# Patient Record
Sex: Female | Born: 1970 | Smoking: Former smoker
Health system: Southern US, Community
[De-identification: ages and names within clinical notes are randomized; demographics above are authoritative.]

## PROBLEM LIST (undated history)

## (undated) ENCOUNTER — Inpatient Hospital Stay (HOSPITAL_COMMUNITY): Payer: Self-pay

## (undated) DIAGNOSIS — B20 Human immunodeficiency virus [HIV] disease: Secondary | ICD-10-CM

## (undated) DIAGNOSIS — I10 Essential (primary) hypertension: Secondary | ICD-10-CM

## (undated) DIAGNOSIS — Z21 Asymptomatic human immunodeficiency virus [HIV] infection status: Secondary | ICD-10-CM

## (undated) HISTORY — PX: INDUCED ABORTION: SHX677

## (undated) HISTORY — DX: Essential (primary) hypertension: I10

---

## 2011-03-25 NOTE — L&D Delivery Note (Signed)
Delivery Note At 1:06 AM a viable female was delivered via Vaginal, Spontaneous Delivery (Presentation: Right Occiput Anterior).  APGAR: 7, 9; weight 8 lb 5.7 oz (3790 g).   Placenta status: Intact, Spontaneous.  Cord: 3 vessels with the following complications: None.  Cord pH: none  Anesthesia: Epidural  Episiotomy: None Lacerations: 2nd degree;Perineal Suture Repair: 2.0 chromic Est. Blood Loss (mL): 350  Mom to postpartum.  Baby to nursery-stable.  Jadzia Ibsen A 10/01/2011, 1:34 AM

## 2011-04-01 LAB — OB RESULTS CONSOLE ABO/RH: RH Type: POSITIVE

## 2011-04-01 LAB — OB RESULTS CONSOLE ANTIBODY SCREEN: Antibody Screen: NEGATIVE

## 2011-04-01 LAB — OB RESULTS CONSOLE GC/CHLAMYDIA: Gonorrhea: NEGATIVE

## 2011-04-02 ENCOUNTER — Other Ambulatory Visit: Payer: Self-pay | Admitting: Obstetrics & Gynecology

## 2011-04-02 DIAGNOSIS — Z3682 Encounter for antenatal screening for nuchal translucency: Secondary | ICD-10-CM

## 2011-04-02 DIAGNOSIS — O09529 Supervision of elderly multigravida, unspecified trimester: Secondary | ICD-10-CM

## 2011-04-09 ENCOUNTER — Ambulatory Visit (HOSPITAL_COMMUNITY)
Admission: RE | Admit: 2011-04-09 | Discharge: 2011-04-09 | Disposition: A | Discharge status: Home / Self Care | Payer: Medicaid Other | Source: Ambulatory Visit | Attending: Obstetrics & Gynecology | Admitting: Obstetrics & Gynecology

## 2011-04-09 ENCOUNTER — Other Ambulatory Visit: Payer: Self-pay

## 2011-04-09 DIAGNOSIS — O09529 Supervision of elderly multigravida, unspecified trimester: Secondary | ICD-10-CM | POA: Insufficient documentation

## 2011-04-09 DIAGNOSIS — Z3689 Encounter for other specified antenatal screening: Secondary | ICD-10-CM | POA: Insufficient documentation

## 2011-04-09 DIAGNOSIS — Z3682 Encounter for antenatal screening for nuchal translucency: Secondary | ICD-10-CM

## 2011-04-09 DIAGNOSIS — O351XX0 Maternal care for (suspected) chromosomal abnormality in fetus, not applicable or unspecified: Secondary | ICD-10-CM | POA: Insufficient documentation

## 2011-04-09 DIAGNOSIS — O3510X0 Maternal care for (suspected) chromosomal abnormality in fetus, unspecified, not applicable or unspecified: Secondary | ICD-10-CM | POA: Insufficient documentation

## 2011-04-10 NOTE — Progress Notes (Addendum)
Genetic Counseling  High-Risk Gestation Note  Appointment Date:  04/09/2011 Referred By: Roseanna Rainbow, * Date of Birth:  08-Nov-1970 Partner:  Haynes Dage Attending: Rema Fendt, MD   Mrs. Zackery Barefoot and her husband, Mr. Nnenna Meador, were seen for genetic counseling because of a maternal age of 41 y.o.Marland Kitchen     They were counseled regarding maternal age and the association with risk for chromosome conditions due to nondisjunction with aging of the ova.   We reviewed chromosomes, nondisjunction, and the associated 1 in 11 risk for fetal aneuploidy related to a maternal age of 41 y.o. at [redacted] weeks gestation.  They were counseled that the risk for aneuploidy decreases as gestational age increases, accounting for those pregnancies which spontaneously abort.  We specifically discussed Down syndrome (trisomy 67), trisomies 36 and 24, and sex chromosome aneuploidies (47,XXX and 47,XXY) including the common features and prognoses of each.   We reviewed available screening and diagnostic options.  Regarding screening tests, we discussed the options of First screen and ultrasound.  They understand that screening tests are used to modify a patient's a priori risk for aneuploidy, typically based on age.  This estimate provides a pregnancy specific risk assessment.  We also reviewed the availability of diagnostic options including CVS and amniocentesis.  We discussed the risks, limitations, and benefits of each.  After reviewing these options, Mrs. Merten elected to have First trimester screening at the time of today's visit, but declined CVS and amniocentesis.   She understands that ultrasound and first trimester screening cannot rule out all birth defects or genetic syndromes. The patient was advised of this limitation and states she still does not want diagnostic testing at this time or in the future given the associated risk of complications.  However, they were counseled that 50-80% of  fetuses with Down syndrome and up to 90% of fetuses with trisomies 13 and 18, when well visualized, have detectable anomalies or soft markers by ultrasound.   Mrs. Cuda was provided with written information regarding sickle cell anemia (SCA) including the carrier frequency and incidence in the African-American population, the availability of carrier testing and prenatal diagnosis if indicated.  In addition, we discussed that hemoglobinopathies are routinely screened for as part of the [place] newborn screening panel.  She declined hemoglobin electrophoresis today.  Both family histories were reviewed and found to be noncontributory for birth defects, mental retardation, and known genetic conditions. Without further information regarding the provided family history, an accurate genetic risk cannot be calculated. Further genetic counseling is warranted if more information is obtained.  Mrs. Graceanne Guin denied exposure to environmental toxins or chemical agents. She denied the use of alcohol, tobacco or street drugs. She denied significant viral illnesses during the course of her pregnancy. Her medical and surgical histories were contributory for 3 past TABs.   I counseled this couple regarding the above risks and available options.  The approximate face-to-face time with the genetic counselor was 45 minutes.  Quinn Plowman, MS,  Certified Genetic Counselor 04/10/2011

## 2011-04-11 DIAGNOSIS — Z113 Encounter for screening for infections with a predominantly sexual mode of transmission: Secondary | ICD-10-CM

## 2011-04-14 ENCOUNTER — Encounter (HOSPITAL_COMMUNITY): Payer: Self-pay

## 2011-04-16 ENCOUNTER — Telehealth: Payer: Self-pay

## 2011-04-16 NOTE — Telephone Encounter (Signed)
Medical records received for newly diagnosed pregnant 042 from [facility].  Dr Jean Rosenthal Christell Constant. Appointment scheduled for 04-16-10 with Dr Drue Second w/o intake.  Any labs needed will be done same day.   I have spoken with patient and she will need assistance with transportation and has expressed concerns for counseling.   [facility] have been [facility].  Alisa with [facility] will call patient today to set up appointment for counseling.  Patient advised the above people will contacting her to make arrangements.   Laurell Josephs, RN  , BSN

## 2011-04-17 ENCOUNTER — Encounter: Payer: Self-pay | Admitting: Internal Medicine

## 2011-04-17 ENCOUNTER — Other Ambulatory Visit: Payer: Self-pay | Admitting: Internal Medicine

## 2011-04-17 ENCOUNTER — Ambulatory Visit (INDEPENDENT_AMBULATORY_CARE_PROVIDER_SITE_OTHER): Payer: Medicaid Other | Admitting: Internal Medicine

## 2011-04-17 ENCOUNTER — Telehealth (HOSPITAL_COMMUNITY): Payer: Self-pay | Admitting: MS"

## 2011-04-17 DIAGNOSIS — IMO0002 Reserved for concepts with insufficient information to code with codable children: Secondary | ICD-10-CM

## 2011-04-17 DIAGNOSIS — E559 Vitamin D deficiency, unspecified: Secondary | ICD-10-CM

## 2011-04-17 DIAGNOSIS — O99810 Abnormal glucose complicating pregnancy: Secondary | ICD-10-CM

## 2011-04-17 DIAGNOSIS — O24419 Gestational diabetes mellitus in pregnancy, unspecified control: Secondary | ICD-10-CM | POA: Insufficient documentation

## 2011-04-17 DIAGNOSIS — Z21 Asymptomatic human immunodeficiency virus [HIV] infection status: Secondary | ICD-10-CM

## 2011-04-17 DIAGNOSIS — E669 Obesity, unspecified: Secondary | ICD-10-CM | POA: Insufficient documentation

## 2011-04-17 DIAGNOSIS — Z23 Encounter for immunization: Secondary | ICD-10-CM

## 2011-04-17 DIAGNOSIS — O09529 Supervision of elderly multigravida, unspecified trimester: Secondary | ICD-10-CM

## 2011-04-17 DIAGNOSIS — B20 Human immunodeficiency virus [HIV] disease: Secondary | ICD-10-CM

## 2011-04-17 DIAGNOSIS — I10 Essential (primary) hypertension: Secondary | ICD-10-CM

## 2011-04-17 LAB — HEPATITIS B SURFACE ANTIBODY,QUALITATIVE: Hep B S Ab: POSITIVE — AB

## 2011-04-17 LAB — HEPATITIS B SURFACE ANTIGEN: Hepatitis B Surface Ag: NEGATIVE

## 2011-04-17 LAB — BASIC METABOLIC PANEL WITH GFR
BUN: 10 mg/dL (ref 6–23)
CO2: 21 mEq/L (ref 19–32)
Calcium: 9.4 mg/dL (ref 8.4–10.5)
GFR, Est African American: >89 mL/min
Glucose, Bld: 98 mg/dL (ref 70–99)
Sodium: 135 mEq/L (ref 135–145)

## 2011-04-17 LAB — CBC WITH DIFFERENTIAL/PLATELET
Eosinophils Relative: 2 % (ref 0–5)
HCT: 34.1 % — ABNORMAL LOW (ref 36.0–46.0)
Lymphocytes Relative: 24 % (ref 12–46)
Lymphs Abs: 1.2 10*3/uL (ref 0.7–4.0)
MCV: 93.9 fL (ref 78.0–100.0)
Monocytes Absolute: 0.4 10*3/uL (ref 0.1–1.0)
Monocytes Relative: 8 % (ref 3–12)
RBC: 3.63 MIL/uL — ABNORMAL LOW (ref 3.87–5.11)
WBC: 5.2 10*3/uL (ref 4.0–10.5)

## 2011-04-17 LAB — HEPATITIS C ANTIBODY: HCV Ab: NEGATIVE

## 2011-04-17 MED ORDER — RITONAVIR 100 MG PO TABS: 100.0000 mg | ORAL_TABLET | Freq: Every day | ORAL | Status: DC

## 2011-04-17 MED ORDER — ATAZANAVIR SULFATE 200 MG PO CAPS: 400.0000 mg | ORAL_CAPSULE | Freq: Every day | ORAL | Status: DC

## 2011-04-17 MED ORDER — EMTRICITABINE-TENOFOVIR DF 200-300 MG PO TABS: 1.0000 | ORAL_TABLET | Freq: Every day | ORAL | Status: DC

## 2011-04-17 NOTE — Progress Notes (Signed)
HIV CLINIC INITIAL VISIT  RFV: to establish care after recent HIV diagnosis as part of pre-natal screening  Subjective:    Patient ID: Stephanie Dixon, female    DOB: [birth year], 41 y.o.   MRN: [id number]  HPI Stephanie Dixon is a 41 yo AAF with history of obesity, non-insulin dependent diabetes mellitus who has recently been diagnosed with HIV during her prenatal visit. She is G11P8, currently at [redacted]wks gestation. Her CD4 count and VL are pending. Her RF for HIV is unprotected sex with HIV infected persons. She has been married for 4 years and not had any other partners. She had lasted tested for HIV prior to being married which was negative, and prior to that she was tested 8 yrs ago, where she was negative at the time. She states that she suspects her current husband is HIV positive and known to have stepped out of their relationship," he would have HIV tests slips in his pockets" while going away for extended periods of time to [facility]. Currently, her 3 youngest children live with her, a set of 40 yr old boys and a 61 yo daughter. She does have older aged children that live in the area. She states that her husband is in somewhat of denial and does not understand how she is HIV+. She has asked her husband to get tested and come to clinic to get further care. She has numerous questions to whether we can tell how long she has been infected with HIV. She is very motivated to protect her own health and the health of unborn child. She would like to start therapy as soon as possible. She has currently been doing diet modification in order to improve her blood sugars within appropriate limits with success, and has lost 5 lbs over the last 2 wks. Otherwise, she is in good health, no fever/chills/cough/nighstweats/acid reflux. She does subscribe to headaches, occasionallly thought to be related to elevated BP.  Gyn history: G11/P8 including 1 multiple birth, 3 induced abortions 04/09/11 [place] shows IUP at 13wk6 days with  EDD of 10/09/11 Rubella immune/RPR/Gc/Chlam negative Oct 2012, normal PAP Sept 2012 induced abortion April 2004 induced abortion Feb 2004 vaginal birth Twin A, csection Twin B (boys) Aug 1998 vaginal (girl) Feb 1994 vaginal (girl) May 1992 induced abortion March 1992 vaginal (girl) Feb 1991 vaginal (girl) March 1990 vaginal (girl) Feb 1989 vaginal (boy)  Past medical history Gestational diabetes Morbid obesity Advanced maternal age, Vitamin deficiency S/p c-section in  No Known Allergies Prior to Admission medications   Medication Sig Start Date End Date Taking? Authorizing Provider  PRENATAL VITAMINS PO Take by mouth.   Yes Historical Provider, MD  atazanavir (REYATAZ) 200 MG capsule Take 2 capsules (400 mg total) by mouth daily with breakfast. 04/17/11 04/16/12  Judyann Munson, MD  emtricitabine-tenofovir (TRUVADA) 200-300 MG per tablet Take 1 tablet by mouth daily. 04/17/11 04/16/12  Judyann Munson, MD  ritonavir (NORVIR) 100 MG TABS Take 1 tablet (100 mg total) by mouth daily with breakfast. 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 Twins 8 y, and 40yo -from a different partner than her current one.  previously smoked < 1/2ppd but now has stopped.no illicit drug use. Family history= asthma, alzheimer's disease, CAD, DM, arthritis  Review of Systems Review of Systems  Constitutional: Negative for fever, chills, diaphoresis, activity change, appetite change, fatigue and unexpected weight change.  HENT: Negative for congestion, sore throat, rhinorrhea,  sneezing, trouble swallowing and sinus pressure.  Eyes: Negative for photophobia and visual disturbance.  Respiratory: Negative for cough, chest tightness, shortness of breath, wheezing and stridor.  Cardiovascular: Negative for chest pain, palpitations and leg swelling.  Gastrointestinal: Negative for nausea, vomiting, abdominal pain, diarrhea, constipation, blood in  stool, abdominal distention and anal bleeding.  Genitourinary: Negative for dysuria, hematuria, flank pain and difficulty urinating.  Musculoskeletal: Negative for myalgias, back pain, joint swelling, arthralgias and gait problem.  Skin: Negative for color change, pallor, rash and wound.  Neurological: Negative for dizziness, tremors, weakness and light-headedness.  Hematological: Negative for adenopathy. Does not bruise/bleed easily.  Psychiatric/Behavioral: Negative for behavioral problems, confusion, sleep disturbance, dysphoric mood, decreased concentration and agitation.     Objective:   Physical Exam BP 167/78  Pulse 96  Temp(Src) 98.6 F (37 C) (Oral)  Wt 315 lb 12 oz (143.223 kg)  LMP 01/07/2011  Constitutional:  oriented to person, place, and time.  well-developed and overweight. No distress.  HENT: PERRLA, EOMI, no scleral icterus Mouth/Throat: Oropharynx is clear and moist. No oropharyngeal exudate.  Cardiovascular: Normal rate, regular rhythm and normal heart sounds. Exam reveals no gallop and no friction rub.  No murmur heard.  Pulmonary/Chest: Effort normal and breath sounds normal. No respiratory distress. He has no wheezes.  Abdominal: Soft. Bowel sounds are normal. He exhibits no distension. There is no tenderness.  Lymphadenopathy:  He has no cervical adenopathy.  Neurological: He is alert and oriented to person, place, and time.  Skin: Skin is warm and dry. No rash noted. No erythema.  Psychiatric: He has a normal mood and affect. His behavior is normal.        Assessment & Plan:  HIV infection during pregnancy = we will start on empiric regimen with atazanavir 400mg  daily, ritonavir 100mg  daily and truvada. Per CDC recommendations, we are giving higher dose of ATZ due to expected increase clearance of atazanavir during 2nd and 3rd trimester of pregnancy.  Once CD 4 count and VL return, we will see if need to start on any OI prophylaxis  The following labs  have been ordered: cbc, cmp, vl, cd4 count, hiv genotype, hepatitis a/b/c/, rpr, gc/chlam  Hypertension = unclear which would be preferred agent for her to start labetolol vs other. Will ask patient to call her ob/gyn to mention that her BP was elevated during this visit and will need to start antihypertensive therapy to avoid pre-eclampsia  Support = will give number to our counselor to help with coping with diagnosis  Have asked her to encourage her husband to come to our clinic to be tested/establish care with one of our providers  Health maintenance = will give flu and pneumococcal vaccine at this visit  HIV prevention = will need to use condoms   rtc in 1 month to check on cd4 count and viral load

## 2011-04-17 NOTE — Telephone Encounter (Signed)
Called Stephanie Dixon to discuss results of First trimester screen. Reviewed that these results are within normal range and reduced the risks for Down syndrome (1 in 1,961) and Trisomy 18/13 (1 in 2,564). This screen is not diagnostic for these conditions and cannot rule out chromosome conditions in the pregnancy. The patient was happy with these results.

## 2011-04-18 LAB — URINALYSIS
Glucose, UA: NEGATIVE mg/dL
Leukocytes, UA: NEGATIVE
Nitrite: NEGATIVE
Specific Gravity, Urine: 1.027 (ref 1.005–1.030)
pH: 6 (ref 5.0–8.0)

## 2011-04-18 LAB — GC/CHLAMYDIA PROBE AMP, URINE
Chlamydia, Swab/Urine, PCR: NEGATIVE
GC Probe Amp, Urine: NEGATIVE

## 2011-04-18 LAB — T-HELPER CELL (CD4) - (RCID CLINIC ONLY)
CD4 % Helper T Cell: 19 % — ABNORMAL LOW (ref 33–55)
CD4 T Cell Abs: 210 uL — ABNORMAL LOW (ref 400–2700)

## 2011-04-21 ENCOUNTER — Other Ambulatory Visit: Payer: Self-pay | Admitting: Internal Medicine

## 2011-04-21 ENCOUNTER — Other Ambulatory Visit: Payer: Medicaid Other

## 2011-04-21 DIAGNOSIS — B20 Human immunodeficiency virus [HIV] disease: Secondary | ICD-10-CM

## 2011-04-21 LAB — COMPREHENSIVE METABOLIC PANEL
ALT: 13 U/L (ref 0–35)
AST: 18 U/L (ref 0–37)
Alkaline Phosphatase: 31 U/L — ABNORMAL LOW (ref 39–117)
Creat: 0.63 mg/dL (ref 0.50–1.10)
Total Bilirubin: 4 mg/dL — ABNORMAL HIGH (ref 0.3–1.2)

## 2011-04-21 LAB — HEPATITIS A ANTIBODY, TOTAL: Hep A Total Ab: NEGATIVE

## 2011-04-21 LAB — HEPATITIS B CORE ANTIBODY, TOTAL: Hep B Core Total Ab: NEGATIVE

## 2011-04-23 ENCOUNTER — Ambulatory Visit: Payer: Medicaid Other | Admitting: Dietician

## 2011-04-23 ENCOUNTER — Telehealth: Payer: Self-pay | Admitting: Internal Medicine

## 2011-04-23 DIAGNOSIS — B20 Human immunodeficiency virus [HIV] disease: Secondary | ICD-10-CM

## 2011-04-23 MED ORDER — RITONAVIR 100 MG PO TABS: 100.0000 mg | ORAL_TABLET | Freq: Two times a day (BID) | ORAL | Status: DC

## 2011-04-23 MED ORDER — DARUNAVIR ETHANOLATE 600 MG PO TABS: 600.0000 mg | ORAL_TABLET | Freq: Two times a day (BID) | ORAL | Status: DC

## 2011-04-23 NOTE — Telephone Encounter (Signed)
Due to jaundice and patient being distraught about scleral icterus, i will change her regimen to the following:  1) continue truvada, 1 tab daily 2) discontinue atazanavir 400mg  daily 3) start darunavir 600mg  BID 4) change ritonavir 100mg  daily to BID  Thus, her final regimen will be truvada 1 tab daily plus  darunavir 600mg /ritonavir 100mg  BID.  I have explained this information to the patient. I have called the pharmacy to ensure she understands new regimen change.

## 2011-04-24 ENCOUNTER — Encounter: Payer: Medicaid Other | Attending: Obstetrics & Gynecology | Admitting: Dietician

## 2011-04-24 DIAGNOSIS — Z21 Asymptomatic human immunodeficiency virus [HIV] infection status: Secondary | ICD-10-CM | POA: Insufficient documentation

## 2011-04-24 DIAGNOSIS — Z713 Dietary counseling and surveillance: Secondary | ICD-10-CM | POA: Insufficient documentation

## 2011-04-24 DIAGNOSIS — E119 Type 2 diabetes mellitus without complications: Secondary | ICD-10-CM | POA: Insufficient documentation

## 2011-04-24 DIAGNOSIS — IMO0002 Reserved for concepts with insufficient information to code with codable children: Secondary | ICD-10-CM

## 2011-04-24 DIAGNOSIS — B20 Human immunodeficiency virus [HIV] disease: Secondary | ICD-10-CM

## 2011-04-24 DIAGNOSIS — E669 Obesity, unspecified: Secondary | ICD-10-CM

## 2011-04-24 DIAGNOSIS — O24419 Gestational diabetes mellitus in pregnancy, unspecified control: Secondary | ICD-10-CM

## 2011-04-24 NOTE — Progress Notes (Signed)
Medical Nutrition Therapy:  Appt start time: 1130 end time:  1300.  Assessment:  Primary concerns today:Pregnancy  (G11/P8) and a new diagnosis of type 2 diabetes.  Was unaware of issues with her blood glucose until she became pregnant.  HgA1C on 04/01/2011 was 6.7% (an average glucose over the last 3 months of 146 mg). Currently she is [redacted] weeks pregnant with an EDD 10/14/2011. Taking her situation seriously and is following the recommendations of her OB doctor.  Has a diagnosis of being HIV positive and is being seen in the Infectious Disease Clinic and is currently on medications. Her stated goal is" to have a healthy baby and when the baby is born to loose weight and to not have diabetes."  BLOOD GLUCOSE: Has the Accu Chek meter and is checking 4 plus times per day.  Has check at extra times because of changes in her feelings.  Checks to make sure glucose is within the prescribed ranges. Fasting:118,123,103,105,123. Post Breakfast:180,135124, 113. Post Lunch:107,131,110,102,106 Post Dinner: [id number]  MEDICATIONS: Prenatal vitamin, Norvir, Prezista, Truvada.  DIETARY INTAKE:  24-hr recall:  B (8:00 AM): egg (1), 1/2 banana, 1 sausage link, Grits, 4 oz of 2% milk and 8 oz of water.  Snk (mid AM) :banana or pear.  L (12:00 PM): [place] sandwich with fat free mayo and lettuce, on whole wheat bread,along with  6 baked potato chips and water. Snk (mid PM): fruit or yogurt D (6:00 PM): beef, green beans, potato, 4 oz 1% milk, and 8 oz of water.  Snk (bedtime PM): piece of fruit. Beverages: water, milk  Recent physical activity: Has started to do some walking.  Encouraged her to work up to 15 minutes twice a day and perhaps as the weather improves she can go to one time per day.  Take advantage of walking in big box stores "and walk, don't shop."   Progress Towards Goal(s):  In progress.   Nutritional Diagnosis:  [place]-2.1 Inpaired nutrition utilization As related to glucose.   As evidenced by diagnosis of type 2 diabetes and pregnancy with an A1C of 6.7%.    Intervention:  Nutrition Completed a review of the diet and the precautions of pregnancy.    If your want to to fry, use non-stick frying pan, use Pam spray or 1-2 tsp of oil canola or vegetable oil, brown the product in the pan.  For the mayo, salad dressing, margarine, Use the regular or the lite and read label and use the serving size indicated.  Don't go for the fat free.  Use the lesser grade tuna  not the Albacore tuna.  Keep at 3 ounces twice a week.  6 ounces in seven days.   No fruit until after 10:00 AM, then use for snack and at meals during the rest of the day.  Fiber:  Bread at least 2-3 gm fiber per slice.  Cereals:  Fiber level of 3 gm or more per serving.  Leave the cereals alone.   On the food label; sugar level/number needs to be 0-9 gm.  Rule of thumb; Don't eat more than 1 cup cooked rice or pasta at lunch or dinner   Handouts given during visit include:  Nutrition, Diabetes, and Pregnancy  Novo Nordisk Carbohydrate Counting Guides  Monitoring/Evaluation:  Dietary intake, exercise, blood glucose levels, and body weight in 4 to 6 week.   I would like to follow-up with her at the M[facility] Clinic at [facility].  I will work with her regarding  dietary interventions.  But, as this pregnancy progresses, if diet and exercise are not sufficient to control her blood glucose levels, the physicians in the Clinic will frequently assist in prescribing and monitoring insulin needs for glucose stabilization.

## 2011-04-24 NOTE — Patient Instructions (Signed)
   Peanut butter, look for a sugar level of 5 gm or less.  Use the sugar substitutes Splenda or Stevia.  Juices with Splenda: Diet V8 Splash  ( Total Carbohydrate is 3 gms)  Use this later in the day, not at breakfast. 2. Diet Ocean Spray Juice  Soda with Splenda:: Diet Rite Cola and Diet Decaffeinated Cherrywine (found more often at [employer].  Soda with Stevia:  Zuvia.  Spaghetti, Whole wheat noodles.  Sauce, Try getting the canned tomatoes, crush and cook slow (simmer) to dehydrate the water, then make your sauce..  If your want to to fry, use non-stick frying pan, use Pam spray or 1-2 tsp of oil canola or vegetable oil, brown the product in the pan.  For the mayo, salad dressing, margarine, Use the regular or the lite and read label and use the serving size indicated.  Don't go for the fat free.  Use the lesser grade tuna  not the Albacore tuna.  Keep at 3 ounces twice a week.  6 ounces in seven days.   No fruit until after 10:00 AM, then use for snack and at meals during the rest of the day.  Fiber:  Bread at least 2-3 gm fiber per slice.  Cereals:  Fiber level of 3 gm or more per serving.  Leave the cereals alone.   On the food label; sugar level/number needs to be 0-9 gm.  Rule of thumb; Don't eat more than 1 cup cooked rice or pasta at lunch or dinner

## 2011-04-26 ENCOUNTER — Inpatient Hospital Stay (HOSPITAL_COMMUNITY)
Admission: AD | Admit: 2011-04-26 | Discharge: 2011-04-26 | Disposition: A | Discharge status: Home / Self Care | Payer: Medicaid Other | Source: Ambulatory Visit | Attending: Obstetrics & Gynecology | Admitting: Obstetrics & Gynecology

## 2011-04-26 ENCOUNTER — Encounter (HOSPITAL_COMMUNITY): Payer: Self-pay | Admitting: *Deleted

## 2011-04-26 DIAGNOSIS — O2390 Unspecified genitourinary tract infection in pregnancy, unspecified trimester: Secondary | ICD-10-CM | POA: Insufficient documentation

## 2011-04-26 DIAGNOSIS — L293 Anogenital pruritus, unspecified: Secondary | ICD-10-CM | POA: Insufficient documentation

## 2011-04-26 DIAGNOSIS — N760 Acute vaginitis: Secondary | ICD-10-CM

## 2011-04-26 HISTORY — DX: Asymptomatic human immunodeficiency virus (hiv) infection status: Z21

## 2011-04-26 HISTORY — DX: Human immunodeficiency virus (HIV) disease: B20

## 2011-04-26 LAB — WET PREP, GENITAL
Clue Cells Wet Prep HPF POC: NONE SEEN
Trich, Wet Prep: NONE SEEN
Yeast Wet Prep HPF POC: NONE SEEN

## 2011-04-26 MED ORDER — NYSTATIN-TRIAMCINOLONE 100000-0.1 UNIT/GM-% EX OINT: TOPICAL_OINTMENT | Freq: Two times a day (BID) | CUTANEOUS | Status: DC

## 2011-04-26 NOTE — ED Provider Notes (Signed)
History   Stephanie Dixon is a 41 y.o. year old [id number] female at [redacted]w[redacted]d weeks gestation who presents to [facility] reporting irritation and pruritis of the introitus. She was tx for VVC a few weeks ago w/ Diflucan. Discharge has resolved. She reports frequent washing w/ soap.    CSN: [id number]  Arrival date & time 04/26/11  1016   None     Chief Complaint  Patient presents with  . Vaginal Pain    (Consider location/radiation/quality/duration/timing/severity/associated sxs/prior treatment) HPI  Past Medical History  Diagnosis Date  . Hypertension   . Diabetes mellitus   . HIV (human immunodeficiency virus infection)     Past Surgical History  Procedure Date  . Cesarean section   . Induced abortion     History reviewed. No pertinent family history.  History  Substance Use Topics  . Smoking status: Former Games developer  . Smokeless tobacco: Never Used  . Alcohol Use: No    OB History    Grav Para Term Preterm Abortions TAB SAB Ect Mult Living   11 7 7  0 3 3 0 0 1 8      Review of Systems  Gastrointestinal: Negative for abdominal pain.  Genitourinary: Negative for dysuria, frequency, hematuria, flank pain, vaginal bleeding, vaginal discharge, genital sores, pelvic pain and dyspareunia.    Allergies  Review of patient's allergies indicates no known allergies.  Home Medications  No current outpatient prescriptions on file.  BP 121/72  Pulse 85  Temp(Src) 98.2 F (36.8 C) (Oral)  Resp 20  Ht 5\' 4"  (1.626 m)  Wt 140.615 kg (310 lb)  BMI 53.21 kg/m2  LMP 01/07/2011  Physical Exam NAD, A&Ox4 Pelvic: mild vulvar erythema, no lesions or discharge FHR +  Recent Results (from the past 168 hour(s))  WET PREP, GENITAL   Collection Time   04/26/11 12:15 PM      Component Value Range   Yeast Wet Prep HPF POC NONE SEEN  NONE SEEN    Trich, Wet Prep NONE SEEN  NONE SEEN    Clue Cells Wet Prep HPF POC NONE SEEN  NONE SEEN    WBC, Wet Prep HPF POC FEW (*) NONE SEEN     GC/CHLAMYDIA PROBE AMP, GENITAL   Collection Time   04/26/11 12:15 PM      Component Value Range   GC Probe Amp, Genital NEGATIVE  NEGATIVE    Chlamydia, DNA Probe NEGATIVE  NEGATIVE    ED Course  Procedures (including critical care time)  1. Contact vaginitis    MDM  D/C home Rx Mycolog Wash vulva w/ water only. Pelvic rest x 1 week to allow healing. Gaynell Face for [employer], [place] 04/26/2011

## 2011-04-26 NOTE — Progress Notes (Signed)
Dx on 04/09/11 with yeast in OB office, taken med (Diflucan?) intermittently since then.  Symptoms would improve and then return, take another pill, same cycle.  Now c/o vaginal pain that is worse, with no d/c.

## 2011-04-28 LAB — GC/CHLAMYDIA PROBE AMP, GENITAL: GC Probe Amp, Genital: NEGATIVE

## 2011-04-29 LAB — HIV-1 GENOTYPR PLUS

## 2011-05-22 ENCOUNTER — Encounter: Payer: Self-pay | Admitting: Internal Medicine

## 2011-05-22 ENCOUNTER — Ambulatory Visit (INDEPENDENT_AMBULATORY_CARE_PROVIDER_SITE_OTHER): Payer: Medicaid Other | Admitting: Internal Medicine

## 2011-05-22 VITALS — BP 145/66 | HR 93 | Temp 98.5°F | Wt 307.0 lb

## 2011-05-22 DIAGNOSIS — B20 Human immunodeficiency virus [HIV] disease: Secondary | ICD-10-CM

## 2011-05-22 DIAGNOSIS — Z21 Asymptomatic human immunodeficiency virus [HIV] infection status: Secondary | ICD-10-CM

## 2011-05-22 NOTE — Progress Notes (Signed)
HIV CLINIC VISIT  RFV:  1 month check up on ART Subjective:    Patient ID: Stephanie Dixon, female    DOB: [birth year], 41 y.o.   MRN: [id number]  HPI Stephanie Dixon is a 41yo F HIV +, CD 4 count of 250(19%), VL 23,705, also at [redacted] wks gestation. She has been on truvada/boosted darunavir for roughly 4 wks. Initially she was started on boosted atazanavir regimen but developed jaundice with Tbili in 5-6; thus, changed over to darunavir as PI backbone. She states that she has been taking her medicines regularly, without difficulty. Denies missing any doses. She has also recently started taking an oral medication for her gestational diabetes which make her feel nausea. Her jaundice has resolved. Her blood sugars in the morning range from low 80s to low 100s.  The patient reports going to ED in early Feb for vaginal itching. She was prescribed a cream which has worked intermittently. She is to see her OB in 4 days for follow-up.  Since starting her medications, she has noticed hypopigmented sub centimeter flat lesions appear on her chest and left cheek. No itching.not progressed. But stable over the last 4 wks.  Stephanie Dixon had several questions regarding perinatal transmission. Which delivery is best vaginal vs. Csection.she also mentioned that her husband, of 4 yrs, is HIV negative.     Prior to Admission medications   Medication Sig Start Date End Date Taking? Authorizing Provider  darunavir (PREZISTA) 600 MG tablet Take 1 tablet (600 mg total) by mouth 2 (two) times daily with a meal. 04/23/11 04/22/12 Yes Judyann Munson, MD  emtricitabine-tenofovir (TRUVADA) 200-300 MG per tablet Take 1 tablet by mouth daily. 04/17/11 04/16/12 Yes Judyann Munson, MD  nystatin-triamcinolone ointment [facility]) Apply topically 2 (two) times daily. 04/26/11 04/25/12 Yes Dorathy Kinsman, CNM  Prenatal Vit-Fe Fumarate-FA (PRENATAL MULTIVITAMIN) TABS Take 1 tablet by mouth daily.   Yes Historical Provider, MD  ritonavir (NORVIR)  100 MG TABS Take 1 tablet (100 mg total) by mouth 2 (two) times daily with a meal. 04/23/11  Yes Judyann Munson, MD   Active Ambulatory Problems    Diagnosis Date Noted  . HIV (human immunodeficiency virus infection) 04/17/2011  . Advanced maternal age risk, currently pregnant 04/17/2011  . Gestational diabetes 04/17/2011  . Hypertension 04/17/2011  . Obesity 04/17/2011  . Twin liveborn born in hospital by C-section 04/17/2011   Resolved Ambulatory Problems    Diagnosis Date Noted  . Vitamin d deficiency 04/17/2011   Past Medical History  Diagnosis Date  . Diabetes mellitus     Review of Systems  Constitutional: Negative for fever, chills, diaphoresis, activity change, appetite change, fatigue and unexpected weight change.  HENT: Negative for congestion, sore throat, rhinorrhea, sneezing, trouble swallowing and sinus pressure.  Respiratory: Negative for cough, chest tightness, shortness of breath, wheezing and stridor.  Cardiovascular: Negative for chest pain, palpitations and leg swelling.  Gastrointestinal: Negative for nausea, vomiting, abdominal pain, diarrhea, constipation, blood in stool, abdominal distention and anal bleeding.  Genitourinary: Negative for dysuria, hematuria, flank pain and difficulty urinating. + vaginal itching Musculoskeletal: Negative for myalgias, back pain, joint swelling, arthralgias and gait problem.  Skin: stable hypopigmented lesions on chest     Objective:   Physical Exam Constitutional: oriented to person, place, and time.well-developed and well-nourished. No distress.  HENT: [place]/AT, PERRLA, EOMI, Mouth/Throat: Oropharynx is clear and moist. No oropharyngeal exudate.  Cardiovascular: Normal rate, regular rhythm and normal heart sounds. Exam reveals no gallop and no friction rub.  No  murmur heard.  Pulmonary/Chest: Effort normal and breath sounds normal. No respiratory distress. He has no wheezes.  Abdominal: Soft. Bowel sounds are normal. He  exhibits no distension. There is no tenderness.  Lymphadenopathy:  He has no cervical adenopathy.  Neurological: He is alert and oriented to person, place, and time.  Skin: Skin is warm and dry. Small hypopigmented flat subcentimeter lesions to chest and left cheek anterior to auricle. No erythema.  Psychiatric: He has a normal mood and affect. His behavior is normal.      Assessment & Plan:  HIV: continue on her current regimen:truvada-boosted darunavir. Will check cbc, cmp, cd4 and hiv vl  Tinea: will check what antifungal creams are safe to use during pregnancy  Vaginal itching: recommend to bring up at ob appt in 4 days to see if needs addn. Treatment. At this time, it appears to have some response to topical cream.  rtc in 1 month

## 2011-05-23 LAB — CBC WITH DIFFERENTIAL/PLATELET
Basophils Relative: 0 % (ref 0–1)
Eosinophils Absolute: 0 10*3/uL (ref 0.0–0.7)
Eosinophils Relative: 1 % (ref 0–5)
Lymphs Abs: 1.3 10*3/uL (ref 0.7–4.0)
MCH: 31.1 pg (ref 26.0–34.0)
MCHC: 33.6 g/dL (ref 30.0–36.0)
MCV: 92.3 fL (ref 78.0–100.0)
Neutrophils Relative %: 65 % (ref 43–77)
Platelets: 203 10*3/uL (ref 150–400)
RBC: 3.51 MIL/uL — ABNORMAL LOW (ref 3.87–5.11)

## 2011-05-23 LAB — COMPREHENSIVE METABOLIC PANEL
ALT: 16 U/L (ref 0–35)
Alkaline Phosphatase: 41 U/L (ref 39–117)
CO2: 24 mEq/L (ref 19–32)
Creat: 0.68 mg/dL (ref 0.50–1.10)
Glucose, Bld: 94 mg/dL (ref 70–99)
Sodium: 136 mEq/L (ref 135–145)
Total Bilirubin: 0.2 mg/dL — ABNORMAL LOW (ref 0.3–1.2)

## 2011-05-23 LAB — T-HELPER CELL (CD4) - (RCID CLINIC ONLY): CD4 % Helper T Cell: 21 % — ABNORMAL LOW (ref 33–55)

## 2011-05-26 ENCOUNTER — Telehealth: Payer: Self-pay | Admitting: *Deleted

## 2011-05-26 LAB — HIV-1 RNA QUANT-NO REFLEX-BLD
HIV 1 RNA Quant: 67 copies/mL — ABNORMAL HIGH (ref <20)
HIV-1 RNA Quant, Log: 1.83 {Log} — ABNORMAL HIGH (ref <1.30)

## 2011-05-26 NOTE — Telephone Encounter (Signed)
RN shared most recent results with the pt.  Explained the meaning of the values and there importance in managing HIV infection.  Praised pt on the lab values and to continue taking medications.  Pt verbalized understanding and confirmed that she would continue to take rxes.  Return appt to [facility] with Dr. Drue Second on 06/19/11 @ 9:45AM.

## 2011-06-19 ENCOUNTER — Ambulatory Visit (INDEPENDENT_AMBULATORY_CARE_PROVIDER_SITE_OTHER): Payer: Medicaid Other | Admitting: Internal Medicine

## 2011-06-19 ENCOUNTER — Encounter: Payer: Self-pay | Admitting: Internal Medicine

## 2011-06-19 VITALS — BP 148/87 | HR 97 | Temp 98.1°F | Wt 303.0 lb

## 2011-06-19 DIAGNOSIS — Z21 Asymptomatic human immunodeficiency virus [HIV] infection status: Secondary | ICD-10-CM

## 2011-06-19 DIAGNOSIS — K219 Gastro-esophageal reflux disease without esophagitis: Secondary | ICD-10-CM

## 2011-06-19 DIAGNOSIS — B20 Human immunodeficiency virus [HIV] disease: Secondary | ICD-10-CM

## 2011-06-19 LAB — CBC WITH DIFFERENTIAL/PLATELET
Basophils Absolute: 0 10*3/uL (ref 0.0–0.1)
Lymphocytes Relative: 24 % (ref 12–46)
Lymphs Abs: 1.1 10*3/uL (ref 0.7–4.0)
Neutro Abs: 3.2 10*3/uL (ref 1.7–7.7)
Neutrophils Relative %: 68 % (ref 43–77)
Platelets: 192 10*3/uL (ref 150–400)
RBC: 3.42 MIL/uL — ABNORMAL LOW (ref 3.87–5.11)
RDW: 13.8 % (ref 11.5–15.5)
WBC: 4.7 10*3/uL (ref 4.0–10.5)

## 2011-06-19 LAB — COMPREHENSIVE METABOLIC PANEL
ALT: 10 U/L (ref 0–35)
AST: 13 U/L (ref 0–37)
Albumin: 3.3 g/dL — ABNORMAL LOW (ref 3.5–5.2)
Calcium: 8.9 mg/dL (ref 8.4–10.5)
Chloride: 105 mEq/L (ref 96–112)
Creat: 0.6 mg/dL (ref 0.50–1.10)
Potassium: 4.2 mEq/L (ref 3.5–5.3)
Sodium: 136 mEq/L (ref 135–145)
Total Protein: 6.6 g/dL (ref 6.0–8.3)

## 2011-06-19 MED ORDER — RANITIDINE HCL 150 MG PO TABS: 150.0000 mg | ORAL_TABLET | Freq: Two times a day (BID) | ORAL | Status: DC | PRN

## 2011-06-19 NOTE — Progress Notes (Signed)
HIV CLINIC NOTE  RFV: routine visit during pregnancy Subjective:    Patient ID: Stephanie Dixon, female    DOB: [birth year], 41 y.o.   MRN: [id number]  HPI Stephanie Dixon recently diagnosed with HIV during prenatal visit, CD 4 count of 290/VL 67 ( 05/22/11)- after 4 wks of truvada/dar/rit. The patient is doing well overall, has not missed a dose of her ART, despite going away to [place], [place] for 3 wks. She states that she is noticing to start having some acid reflux/GERD, but has not taken any medications. She states that she is managing her BS fairly well. Her am BS range from 90-120, and BS max during the day is 140. She has noticed her BP occasionally elevated during visits, thought to be related to stress. No f/chills/nausea/vomiting/nightsweats/abdominal pain/ rash/headache/blood in urine/vaginal itching  She did sustain a burn to dorsum of right hand from lightbulb at nail salon a few weeks ago. It is healing well, non tender, but now has hyperpigmented circular lesion on her hand. Her previous rash on chest is now cleared.  Prior to Admission medications   Medication Sig Start Date End Date Taking? Authorizing Provider  darunavir (PREZISTA) 600 MG tablet Take 1 tablet (600 mg total) by mouth 2 (two) times daily with a meal. 04/23/11 04/22/12  Judyann Munson, MD  emtricitabine-tenofovir (TRUVADA) 200-300 MG per tablet Take 1 tablet by mouth daily. 04/17/11 04/16/12  Judyann Munson, MD  nystatin-triamcinolone ointment [facility]) Apply topically 2 (two) times daily. 04/26/11 04/25/12  Dorathy Kinsman, CNM  Prenatal Vit-Fe Fumarate-FA (PRENATAL MULTIVITAMIN) TABS Take 1 tablet by mouth daily.    Historical Provider, MD  ranitidine (ZANTAC) 150 MG tablet Take 1 tablet (150 mg total) by mouth 2 (two) times daily as needed for heartburn. 06/19/11 06/18/12  Judyann Munson, MD  ritonavir (NORVIR) 100 MG TABS Take 1 tablet (100 mg total) by mouth 2 (two) times daily with a meal. 04/23/11   Judyann Munson, MD       Review of Systems Review of Systems  Constitutional: Negative for fever, chills, diaphoresis, activity change, appetite change, fatigue and unexpected weight change.  HENT: Negative for congestion, sore throat, rhinorrhea, sneezing, trouble swallowing and sinus pressure.  Eyes: Negative for photophobia and visual disturbance.  Respiratory: Negative for cough, chest tightness, shortness of breath, wheezing and stridor.  Cardiovascular: Negative for chest pain, palpitations and leg swelling.  Gastrointestinal: positive for reflux. Negative for nausea, vomiting, abdominal pain, diarrhea, constipation, blood in stool, abdominal distention and anal bleeding.  Genitourinary: Negative for dysuria, hematuria, flank pain and difficulty urinating.  Musculoskeletal: Negative for myalgias, back pain, joint swelling, arthralgias and gait problem.  Skin: Negative for color change, pallor, rash and wound.  Neurological: Negative for dizziness, tremors, weakness and light-headedness.  Hematological: Negative for adenopathy. Does not bruise/bleed easily.  Psychiatric/Behavioral: Negative for behavioral problems, confusion, sleep disturbance, dysphoric mood, decreased concentration and agitation.       Objective:   Physical Exam BP 148/87  Pulse 97  Temp(Src) 98.1 F (36.7 C) (Oral)  Wt 303 lb (137.44 kg)  LMP 01/07/2011  General Appearance:    Alert, cooperative, no distress, appears stated age  Head:    Normocephalic, without obvious abnormality, atraumatic  Eyes:    PERRL, conjunctiva/corneas clear, EOM's intact, fundi    benign, both eyes  Ears:    Normal TM's and external ear canals, both ears  Nose:   Nares normal, septum midline, mucosa normal, no drainage    or sinus tenderness  Throat:  Lips, mucosa, and tongue normal; teeth and gums normal  Neck:   Supple, symmetrical, trachea midline, no adenopathy;    thyroid:  no enlargement/tenderness/nodules; no carotid   bruit or JVD   Back:     Symmetric, no curvature, ROM normal, no CVA tenderness  Lungs:     Clear to auscultation bilaterally, respirations unlabored  Chest Wall:    No tenderness or deformity   Heart:    Regular rate and rhythm, S1 and S2 normal, no murmur, rub   or gallop     Abdomen:     Protuberant gravis abdomen, bowel sounds active all four quadrants,    no masses, no organomegaly        Extremities:   Extremities normal, atraumatic, no cyanosis or edema  Pulses:   2+ and symmetric all extremities  Skin:   1 cm circular hyperpigmented lesion on dorsum of right hand.Skin color, texture, turgor normal, no rashes or lesions  Lymph nodes:   Cervical, supraclavicular, and axillary nodes normal           Assessment & Plan:   HIV= continue on truvada/dar/r. Will check CD 4 count and viral load. Continue with excellent adherence  GERD = will give rx for ranitidine. Can also ask patient to take tums. Would avoid PPI for now.  Elevated BP= will ask patient to follow up with OB to see when to initiate anti-hypertensives. This is the 2nd visit where her BP is elevated.  rtc in 4-6 wks.

## 2011-06-20 LAB — T-HELPER CELL (CD4) - (RCID CLINIC ONLY): CD4 % Helper T Cell: 21 % — ABNORMAL LOW (ref 33–55)

## 2011-06-23 ENCOUNTER — Other Ambulatory Visit: Payer: Self-pay | Admitting: Obstetrics & Gynecology

## 2011-06-23 DIAGNOSIS — Z3689 Encounter for other specified antenatal screening: Secondary | ICD-10-CM

## 2011-06-26 ENCOUNTER — Ambulatory Visit (HOSPITAL_COMMUNITY)
Admission: RE | Admit: 2011-06-26 | Discharge: 2011-06-26 | Disposition: A | Discharge status: Home / Self Care | Payer: Medicaid Other | Source: Ambulatory Visit | Attending: Obstetrics & Gynecology | Admitting: Obstetrics & Gynecology

## 2011-06-26 DIAGNOSIS — O98519 Other viral diseases complicating pregnancy, unspecified trimester: Secondary | ICD-10-CM | POA: Insufficient documentation

## 2011-06-26 DIAGNOSIS — Z3689 Encounter for other specified antenatal screening: Secondary | ICD-10-CM

## 2011-06-26 DIAGNOSIS — Z21 Asymptomatic human immunodeficiency virus [HIV] infection status: Secondary | ICD-10-CM | POA: Insufficient documentation

## 2011-06-26 DIAGNOSIS — O34219 Maternal care for unspecified type scar from previous cesarean delivery: Secondary | ICD-10-CM | POA: Insufficient documentation

## 2011-06-26 DIAGNOSIS — O24919 Unspecified diabetes mellitus in pregnancy, unspecified trimester: Secondary | ICD-10-CM | POA: Insufficient documentation

## 2011-06-26 DIAGNOSIS — O10019 Pre-existing essential hypertension complicating pregnancy, unspecified trimester: Secondary | ICD-10-CM | POA: Insufficient documentation

## 2011-06-26 DIAGNOSIS — O358XX0 Maternal care for other (suspected) fetal abnormality and damage, not applicable or unspecified: Secondary | ICD-10-CM | POA: Insufficient documentation

## 2011-08-05 ENCOUNTER — Ambulatory Visit (INDEPENDENT_AMBULATORY_CARE_PROVIDER_SITE_OTHER): Payer: Medicaid Other | Admitting: Internal Medicine

## 2011-08-05 VITALS — BP 126/86 | HR 89 | Temp 98.3°F | Ht 64.0 in | Wt 297.0 lb

## 2011-08-05 DIAGNOSIS — O98519 Other viral diseases complicating pregnancy, unspecified trimester: Secondary | ICD-10-CM

## 2011-08-05 DIAGNOSIS — Z21 Asymptomatic human immunodeficiency virus [HIV] infection status: Secondary | ICD-10-CM

## 2011-08-05 DIAGNOSIS — B20 Human immunodeficiency virus [HIV] disease: Secondary | ICD-10-CM

## 2011-09-03 ENCOUNTER — Other Ambulatory Visit: Payer: Self-pay | Admitting: Obstetrics & Gynecology

## 2011-09-03 DIAGNOSIS — R1013 Epigastric pain: Secondary | ICD-10-CM

## 2011-09-04 ENCOUNTER — Ambulatory Visit: Payer: Medicaid Other | Admitting: Internal Medicine

## 2011-09-08 ENCOUNTER — Ambulatory Visit (HOSPITAL_COMMUNITY)
Admission: RE | Admit: 2011-09-08 | Discharge: 2011-09-08 | Disposition: A | Discharge status: Home / Self Care | Payer: Medicaid Other | Source: Ambulatory Visit | Attending: Obstetrics & Gynecology | Admitting: Obstetrics & Gynecology

## 2011-09-08 DIAGNOSIS — O99891 Other specified diseases and conditions complicating pregnancy: Secondary | ICD-10-CM | POA: Insufficient documentation

## 2011-09-08 DIAGNOSIS — R1013 Epigastric pain: Secondary | ICD-10-CM | POA: Insufficient documentation

## 2011-09-24 ENCOUNTER — Encounter: Payer: Self-pay | Admitting: Internal Medicine

## 2011-09-24 ENCOUNTER — Ambulatory Visit (INDEPENDENT_AMBULATORY_CARE_PROVIDER_SITE_OTHER): Payer: Medicaid Other | Admitting: Internal Medicine

## 2011-09-24 VITALS — BP 114/72 | HR 82 | Temp 98.2°F | Wt 298.0 lb

## 2011-09-24 DIAGNOSIS — B20 Human immunodeficiency virus [HIV] disease: Secondary | ICD-10-CM

## 2011-09-24 DIAGNOSIS — Z21 Asymptomatic human immunodeficiency virus [HIV] infection status: Secondary | ICD-10-CM

## 2011-09-24 LAB — COMPREHENSIVE METABOLIC PANEL
Albumin: 3.2 g/dL — ABNORMAL LOW (ref 3.5–5.2)
Alkaline Phosphatase: 95 U/L (ref 39–117)
BUN: 11 mg/dL (ref 6–23)
Calcium: 9 mg/dL (ref 8.4–10.5)
Chloride: 108 mEq/L (ref 96–112)
Glucose, Bld: 105 mg/dL — ABNORMAL HIGH (ref 70–99)
Potassium: 3.9 mEq/L (ref 3.5–5.3)

## 2011-09-24 LAB — CBC WITH DIFFERENTIAL/PLATELET
Basophils Relative: 0 % (ref 0–1)
Eosinophils Relative: 1 % (ref 0–5)
HCT: 34.9 % — ABNORMAL LOW (ref 36.0–46.0)
Hemoglobin: 12.3 g/dL (ref 12.0–15.0)
MCHC: 35.2 g/dL (ref 30.0–36.0)
MCV: 90.6 fL (ref 78.0–100.0)
Monocytes Absolute: 0.4 10*3/uL (ref 0.1–1.0)
Monocytes Relative: 10 % (ref 3–12)
Neutro Abs: 2.7 10*3/uL (ref 1.7–7.7)

## 2011-09-26 LAB — T-HELPER CELL (CD4) - (RCID CLINIC ONLY): CD4 T Cell Abs: 240 uL — ABNORMAL LOW (ref 400–2700)

## 2011-09-29 ENCOUNTER — Other Ambulatory Visit: Payer: Self-pay | Admitting: Obstetrics

## 2011-09-29 LAB — HIV-1 RNA QUANT-NO REFLEX-BLD: HIV-1 RNA Quant, Log: <1.3 {Log} (ref <1.30)

## 2011-09-29 NOTE — Progress Notes (Signed)
Subjective:    Patient ID: Stephanie Dixon, female    DOB: 1971/03/09, 41 y.o.   MRN: [id number]  HPICharlotte is HIV positive CD 4 count of 230, VL <20,(06/19/2011) on truvada/darunavir/ritonavir, almost [redacted] week gestation. She reports excellent adherence to her HIV medications. She has numerous questions regarding what happens at delivery regarding medications for her newborn and who shall be her pediatrician, and what testing will be done for her infant.She is also wondering what are the risks for her child to be born with HIV.She  states that her pregnancy related GERD is treated with ranitidine. She is fatigued and also slightly worried since she has not disclosed her HIV status to her mother, who is visiting until her baby is born. She is also looking forward to when she can change her ART regimen to a single pill per day.  Current Outpatient Prescriptions on File Prior to Visit  Medication Sig Dispense Refill  . darunavir (PREZISTA) 600 MG tablet Take 1 tablet (600 mg total) by mouth 2 (two) times daily with a meal.  60 tablet  5  . emtricitabine-tenofovir (TRUVADA) 200-300 MG per tablet Take 1 tablet by mouth daily.  30 tablet  5  . nystatin-triamcinolone ointment (MYCOLOG) Apply topically 2 (two) times daily.  30 g  1  . Prenatal Vit-Fe Fumarate-FA (PRENATAL MULTIVITAMIN) TABS Take 1 tablet by mouth daily.      . ranitidine (ZANTAC) 150 MG tablet Take 1 tablet (150 mg total) by mouth 2 (two) times daily as needed for heartburn.  60 tablet  5  . ritonavir (NORVIR) 100 MG TABS Take 1 tablet (100 mg total) by mouth 2 (two) times daily with a meal.  60 tablet  5   Active Ambulatory Problems    Diagnosis Date Noted  . HIV (human immunodeficiency virus infection) 04/17/2011  . Advanced maternal age risk, currently pregnant 04/17/2011  . Gestational diabetes 04/17/2011  . Hypertension 04/17/2011  . Obesity 04/17/2011  . Twin liveborn born in hospital by C-section 04/17/2011   Resolved  Ambulatory Problems    Diagnosis Date Noted  . Vitamin d deficiency 04/17/2011   Past Medical History  Diagnosis Date  . Diabetes mellitus     Review of Systems   Constitutional: Negative for fever, chills, diaphoresis, activity change, appetite change, fatigue and unexpected weight change.  HENT: Negative for congestion, sore throat, rhinorrhea, sneezing, trouble swallowing and sinus pressure.  Eyes: Negative for photophobia and visual disturbance.  Respiratory: Negative for cough, chest tightness, shortness of breath, wheezing and stridor.  Cardiovascular: Negative for chest pain, palpitations and leg swelling.  Gastrointestinal: Negative for nausea, vomiting, abdominal pain, diarrhea, constipation, blood in stool, abdominal distention and anal bleeding.  Genitourinary: Negative for dysuria, hematuria, flank pain and difficulty urinating.  Musculoskeletal: Negative for myalgias, back pain, joint swelling, arthralgias and gait problem.  Skin: Negative for color change, pallor, rash and wound.  Neurological: Negative for dizziness, tremors, weakness and light-headedness.  Hematological: Negative for adenopathy. Does not bruise/bleed easily.  Psychiatric/Behavioral: Negative for behavioral problems, confusion, sleep disturbance, dysphoric mood, decreased concentration and agitation.       Objective:   Physical Exam Physical Exam  Constitutional: she is oriented to person, place, and time.  appears well-developed, gravid abdomen. No distress.  HENT: [place]/AT, no scleral icterus Mouth/Throat: Oropharynx is clear and moist. No oropharyngeal exudate.  Cardiovascular: Normal rate, regular rhythm and normal heart sounds. Exam reveals no gallop and no friction rub.  No murmur heard.  Pulmonary/Chest: Effort  normal and breath sounds normal. No respiratory distress.  no wheezes.  Abdominal: deferred exam Lymphadenopathy:  no cervical adenopathy.  Neurological: He is alert and oriented to  person, place, and time.  Skin: Skin is warm and dry. No rash noted. No erythema.      Assessment & Plan:  HIV = continue on current regimen of truvada/darunavir/ritonavir. Will change her regimen at next visit. Will check CD 4 count and viral load at this visit.   HIV prevention = I reassured the patient that since she is virologically controlled and on HIV treatment that this is the best scenario for her infant. She is also aware that she won't be able to breast feed her child, for which she was not planning on doing.   Infant = she will likely [facility] for her infant  rtc in 7 -8 weeks  Mandisa Persinger B. Drue Second MD MPH [facility] [phone]

## 2011-09-30 ENCOUNTER — Inpatient Hospital Stay (HOSPITAL_COMMUNITY)
Admission: AD | Admit: 2011-09-30 | Discharge: 2011-09-30 | Disposition: A | Discharge status: Home / Self Care | Payer: Medicaid Other | Source: Ambulatory Visit | Attending: Obstetrics | Admitting: Obstetrics

## 2011-09-30 ENCOUNTER — Encounter (HOSPITAL_COMMUNITY): Payer: Self-pay | Admitting: Anesthesiology

## 2011-09-30 ENCOUNTER — Inpatient Hospital Stay (HOSPITAL_COMMUNITY)
Admission: AD | Admit: 2011-09-30 | Discharge: 2011-10-03 | DRG: 774 | Disposition: A | Discharge status: Home / Self Care | Payer: Medicaid Other | Source: Ambulatory Visit | Attending: Obstetrics | Admitting: Obstetrics

## 2011-09-30 ENCOUNTER — Inpatient Hospital Stay (HOSPITAL_COMMUNITY): Payer: Medicaid Other | Admitting: Anesthesiology

## 2011-09-30 ENCOUNTER — Encounter (HOSPITAL_COMMUNITY): Payer: Self-pay | Admitting: *Deleted

## 2011-09-30 DIAGNOSIS — O09529 Supervision of elderly multigravida, unspecified trimester: Secondary | ICD-10-CM | POA: Diagnosis present

## 2011-09-30 DIAGNOSIS — O479 False labor, unspecified: Secondary | ICD-10-CM | POA: Insufficient documentation

## 2011-09-30 DIAGNOSIS — IMO0002 Reserved for concepts with insufficient information to code with codable children: Secondary | ICD-10-CM

## 2011-09-30 DIAGNOSIS — B20 Human immunodeficiency virus [HIV] disease: Secondary | ICD-10-CM

## 2011-09-30 DIAGNOSIS — O24419 Gestational diabetes mellitus in pregnancy, unspecified control: Secondary | ICD-10-CM

## 2011-09-30 DIAGNOSIS — O98519 Other viral diseases complicating pregnancy, unspecified trimester: Secondary | ICD-10-CM | POA: Diagnosis present

## 2011-09-30 DIAGNOSIS — E669 Obesity, unspecified: Secondary | ICD-10-CM

## 2011-09-30 DIAGNOSIS — Z21 Asymptomatic human immunodeficiency virus [HIV] infection status: Secondary | ICD-10-CM | POA: Diagnosis present

## 2011-09-30 DIAGNOSIS — I10 Essential (primary) hypertension: Secondary | ICD-10-CM

## 2011-09-30 LAB — CBC
HCT: 37 % (ref 36.0–46.0)
MCH: 31.4 pg (ref 26.0–34.0)
MCHC: 34.1 g/dL (ref 30.0–36.0)
RDW: 14.7 % (ref 11.5–15.5)

## 2011-09-30 LAB — ABO/RH: ABO/RH(D): O POS

## 2011-09-30 LAB — PREPARE RBC (CROSSMATCH)

## 2011-09-30 MED ORDER — EPHEDRINE 5 MG/ML INJ: 10.0000 mg | INTRAVENOUS | Status: DC | PRN

## 2011-09-30 MED ORDER — FLEET ENEMA 7-19 GM/118ML RE ENEM: 1.0000 | ENEMA | RECTAL | Status: DC | PRN

## 2011-09-30 MED ORDER — ZIDOVUDINE 10 MG/ML IV SOLN
1.0000 mg/kg/h | INTRAVENOUS | Status: DC
  Administered 2011-09-30: 1 mg/kg/h via INTRAVENOUS
  Filled 2011-09-30: qty 40

## 2011-09-30 MED ORDER — OXYTOCIN BOLUS FROM INFUSION
250.0000 mL | Freq: Once | INTRAVENOUS | Status: DC
  Filled 2011-09-30: qty 500

## 2011-09-30 MED ORDER — IBUPROFEN 600 MG PO TABS: 600.0000 mg | ORAL_TABLET | Freq: Four times a day (QID) | ORAL | Status: DC | PRN

## 2011-09-30 MED ORDER — CITRIC ACID-SODIUM CITRATE 334-500 MG/5ML PO SOLN: 30.0000 mL | ORAL | Status: DC | PRN

## 2011-09-30 MED ORDER — OXYTOCIN 40 UNITS IN LACTATED RINGERS INFUSION - SIMPLE MED
1.0000 m[IU]/min | INTRAVENOUS | Status: DC
  Administered 2011-09-30: 2 m[IU]/min via INTRAVENOUS

## 2011-09-30 MED ORDER — LACTATED RINGERS IV SOLN: 500.0000 mL | INTRAVENOUS | Status: DC | PRN

## 2011-09-30 MED ORDER — EPHEDRINE 5 MG/ML INJ
10.0000 mg | INTRAVENOUS | Status: DC | PRN
  Filled 2011-09-30: qty 4

## 2011-09-30 MED ORDER — SODIUM CHLORIDE 0.9 % IV SOLN
2.0000 g | Freq: Once | INTRAVENOUS | Status: AC
  Administered 2011-09-30: 2 g via INTRAVENOUS
  Filled 2011-09-30: qty 2000

## 2011-09-30 MED ORDER — BUTORPHANOL TARTRATE 2 MG/ML IJ SOLN
1.0000 mg | INTRAMUSCULAR | Status: DC | PRN
  Administered 2011-09-30: 1 mg via INTRAVENOUS
  Filled 2011-09-30: qty 1

## 2011-09-30 MED ORDER — PHENYLEPHRINE 40 MCG/ML (10ML) SYRINGE FOR IV PUSH (FOR BLOOD PRESSURE SUPPORT)
80.0000 ug | PREFILLED_SYRINGE | INTRAVENOUS | Status: DC | PRN
  Filled 2011-09-30: qty 5

## 2011-09-30 MED ORDER — LACTATED RINGERS IV SOLN
INTRAVENOUS | Status: DC
  Administered 2011-09-30 (×2): via INTRAVENOUS

## 2011-09-30 MED ORDER — TERBUTALINE SULFATE 1 MG/ML IJ SOLN: 0.2500 mg | Freq: Once | INTRAMUSCULAR | Status: AC | PRN

## 2011-09-30 MED ORDER — MISOPROSTOL 200 MCG PO TABS
ORAL_TABLET | ORAL | Status: AC
  Filled 2011-09-30: qty 4

## 2011-09-30 MED ORDER — PHENYLEPHRINE 40 MCG/ML (10ML) SYRINGE FOR IV PUSH (FOR BLOOD PRESSURE SUPPORT): 80.0000 ug | PREFILLED_SYRINGE | INTRAVENOUS | Status: DC | PRN

## 2011-09-30 MED ORDER — ZIDOVUDINE 10 MG/ML IV SOLN
2.0000 mg/kg | Freq: Once | INTRAVENOUS | Status: AC
  Administered 2011-09-30: 274 mg via INTRAVENOUS
  Filled 2011-09-30: qty 27.4

## 2011-09-30 MED ORDER — BUTORPHANOL TARTRATE 2 MG/ML IJ SOLN: 1.0000 mg | Freq: Once | INTRAMUSCULAR | Status: DC

## 2011-09-30 MED ORDER — FENTANYL 2.5 MCG/ML BUPIVACAINE 1/10 % EPIDURAL INFUSION (WH - ANES)
14.0000 mL/h | INTRAMUSCULAR | Status: DC
  Administered 2011-09-30 – 2011-10-01 (×2): 14 mL/h via EPIDURAL
  Filled 2011-09-30 (×2): qty 60

## 2011-09-30 MED ORDER — OXYCODONE-ACETAMINOPHEN 5-325 MG PO TABS: 1.0000 | ORAL_TABLET | ORAL | Status: DC | PRN

## 2011-09-30 MED ORDER — ONDANSETRON HCL 4 MG/2ML IJ SOLN: 4.0000 mg | Freq: Four times a day (QID) | INTRAMUSCULAR | Status: DC | PRN

## 2011-09-30 MED ORDER — OXYTOCIN 40 UNITS IN LACTATED RINGERS INFUSION - SIMPLE MED
62.5000 mL/h | Freq: Once | INTRAVENOUS | Status: AC
  Administered 2011-10-01: 62.5 mL/h via INTRAVENOUS
  Filled 2011-09-30: qty 1000

## 2011-09-30 MED ORDER — LIDOCAINE HCL (PF) 1 % IJ SOLN
INTRAMUSCULAR | Status: DC | PRN
  Administered 2011-09-30 (×2): 5 mL

## 2011-09-30 MED ORDER — LIDOCAINE HCL (PF) 1 % IJ SOLN
30.0000 mL | INTRAMUSCULAR | Status: DC | PRN
  Administered 2011-10-01: 30 mL via SUBCUTANEOUS
  Filled 2011-09-30: qty 30

## 2011-09-30 MED ORDER — DIPHENHYDRAMINE HCL 50 MG/ML IJ SOLN: 12.5000 mg | INTRAMUSCULAR | Status: DC | PRN

## 2011-09-30 MED ORDER — ACETAMINOPHEN 325 MG PO TABS: 650.0000 mg | ORAL_TABLET | ORAL | Status: DC | PRN

## 2011-09-30 MED ORDER — LACTATED RINGERS IV SOLN: 500.0000 mL | Freq: Once | INTRAVENOUS | Status: DC

## 2011-09-30 NOTE — Progress Notes (Signed)
Talked to Dr Clearance Coots in regards to pt's concerns on pain in back, VE, FHR, contraction pattern. Orders received

## 2011-09-30 NOTE — Progress Notes (Signed)
Called Dr Clearance Coots in regards to pt's status and pt's request. Explained to Dr Clearance Coots that she states she needs a cesarean section because she is HIV positive. He states to monitor patient and call him back.

## 2011-09-30 NOTE — Plan of Care (Signed)
Problem: Consults Goal: Birthing Suites Patient Information Press F2 to bring up selections list Outcome: Completed/Met Date Met:  09/30/11  Pt 37-[redacted] weeks EGA     

## 2011-09-30 NOTE — Anesthesia Preprocedure Evaluation (Signed)
Anesthesia Evaluation  Patient identified by MRN, date of birth, ID band Patient awake    Reviewed: Allergy & Precautions, H&P , Patient's Chart, lab work & pertinent test results  Airway Mallampati: III TM Distance: >3 FB Neck ROM: full    Dental No notable dental hx.    Pulmonary neg pulmonary ROS,  breath sounds clear to auscultation  Pulmonary exam normal       Cardiovascular hypertension, negative cardio ROS  Rhythm:regular Rate:Normal     Neuro/Psych negative neurological ROS  negative psych ROS   GI/Hepatic negative GI ROS, Neg liver ROS,   Endo/Other  negative endocrine ROSMorbid obesity  Renal/GU negative Renal ROS     Musculoskeletal   Abdominal   Peds  Hematology negative hematology ROS (+)   Anesthesia Other Findings Hypertension     Diabetes mellitus        HIV (human immunodeficiency virus infection)        Reproductive/Obstetrics (+) Pregnancy                           Anesthesia Physical Anesthesia Plan  ASA: III  Anesthesia Plan: Epidural   Post-op Pain Management:    Induction:   Airway Management Planned:   Additional Equipment:   Intra-op Plan:   Post-operative Plan:   Informed Consent: I have reviewed the patients History and Physical, chart, labs and discussed the procedure including the risks, benefits and alternatives for the proposed anesthesia with the patient or authorized representative who has indicated his/her understanding and acceptance.     Plan Discussed with:   Anesthesia Plan Comments:         Anesthesia Quick Evaluation

## 2011-09-30 NOTE — MAU Note (Signed)
Pt presents with complaints of labor since this morning at approximately 6am. Denies any leakage of fluid or bleeding. States baby is active. Also states that she was diagnosed with HIV at the beginning of pregnancy

## 2011-09-30 NOTE — Progress Notes (Signed)
Jakari Jacot is a 41 y.o. [id number] at [redacted]w[redacted]d by LMP admitted for active labor  Subjective:   Objective: BP 131/84  Pulse 90  Temp 98.2 F (36.8 C) (Oral)  Resp 20  Ht 5\' 4"  (1.626 m)  Wt 136.986 kg (302 lb)  BMI 51.84 kg/m2  SpO2 99%  LMP 01/07/2011      FHT:  FHR: 150 bpm, variability: moderate,  accelerations:  Present,  decelerations:  Absent UC:   regular, every 2-3 minutes SVE:   Dilation: 10 Effacement (%): 100 Station: +1 Exam by:: Dupont, Rn   Labs: Lab Results  Component Value Date   WBC 6.6 09/30/2011   HGB 12.6 09/30/2011   HCT 37.0 09/30/2011   MCV 92.3 09/30/2011   PLT 170 09/30/2011    Assessment / Plan: Spontaneous labor, progressing normally  Labor: Progressing normally Preeclampsia:  n/a Fetal Wellbeing:  Category I Pain Control:  Epidural I/D:  n/a Anticipated MOD:  NSVD  HARPER,CHARLES A 09/30/2011, 9:40 PM

## 2011-09-30 NOTE — MAU Note (Signed)
Patient state she is having contractions that are hurting more with bloody show. Reports good fetal movement. Scant amount of brown discharge on a washcloth patient has on. Denies watery discharge.

## 2011-09-30 NOTE — MAU Note (Signed)
Dr Clearance Coots at bedside with patient, plan of care discussed.

## 2011-09-30 NOTE — Progress Notes (Signed)
MD called to check on pt, informed he wants pt to get full AZT therapy before delivering.  MD wants to be called as soon as SROM & defer vag exams until AZT therapy complete.  Orders rcvd for pt to have Stadol 1mg .

## 2011-09-30 NOTE — Discharge Instructions (Signed)
Normal Labor and Delivery Your caregiver must first be sure you are in labor. Signs of labor include:  You may pass what is called "the mucus plug" before labor begins. This is a small amount of blood stained mucus.   Regular uterine contractions.   The time between contractions get closer together.   The discomfort and pain gradually gets more intense.   Pains are mostly located in the back.   Pains get worse when walking.   The cervix (the opening of the uterus becomes thinner (begins to efface) and opens up (dilates).  Once you are in labor and admitted into the hospital or care center, your caregiver will do the following:  A complete physical examination.   Check your vital signs (blood pressure, pulse, temperature and the fetal heart rate).   Do a vaginal examination (using a sterile glove and lubricant) to determine:   The position (presentation) of the baby (head [vertex] or buttock first).   The level (station) of the baby's head in the birth canal.   The effacement and dilatation of the cervix.   You may have your pubic hair shaved and be given an enema depending on your caregiver and the circumstance.   An electronic monitor is usually placed on your abdomen. The monitor follows the length and intensity of the contractions, as well as the baby's heart rate.   Usually, your caregiver will insert an IV in your arm with a bottle of sugar water. This is done as a precaution so that medications can be given to you quickly during labor or delivery.  NORMAL LABOR AND DELIVERY IS DIVIDED UP INTO 3 STAGES: First Stage This is when regular contractions begin and the cervix begins to efface and dilate. This stage can last from 3 to 15 hours. The end of the first stage is when the cervix is 100% effaced and 10 centimeters dilated. Pain medications may be given by   Injection (morphine, demerol, etc.)   Regional anesthesia (spinal, caudal or epidural, anesthetics given in  different locations of the spine). Paracervical pain medication may be given, which is an injection of and anesthetic on each side of the cervix.  A pregnant woman may request to have "Natural Childbirth" which is not to have any medications or anesthesia during her labor and delivery. Second Stage This is when the baby comes down through the birth canal (vagina) and is born. This can take 1 to 4 hours. As the baby's head comes down through the birth canal, you may feel like you are going to have a bowel movement. You will get the urge to bear down and push until the baby is delivered. As the baby's head is being delivered, the caregiver will decide if an episiotomy (a cut in the perineum and vagina area) is needed to prevent tearing of the tissue in this area. The episiotomy is sewn up after the delivery of the baby and placenta. Sometimes a mask with nitrous oxide is given for the mother to breath during the delivery of the baby to help if there is too much pain. The end of Stage 2 is when the baby is fully delivered. Then when the umbilical cord stops pulsating it is clamped and cut. Third Stage The third stage begins after the baby is completely delivered and ends after the placenta (afterbirth) is delivered. This usually takes 5 to 30 minutes. After the placenta is delivered, a medication is given either by intravenous or injection to help contract   the uterus and prevent bleeding. The third stage is not painful and pain medication is usually not necessary. If an episiotomy was done, it is repaired at this time. After the delivery, the mother is watched and monitored closely for 1 to 2 hours to make sure there is no postpartum bleeding (hemorrhage). If there is a lot of bleeding, medication is given to contract the uterus and stop the bleeding. Document Released: 12/18/2007 Document Revised: 02/27/2011 Document Reviewed: 12/18/2007 [employer] Patient Information 2012 [employer]. 

## 2011-09-30 NOTE — Anesthesia Procedure Notes (Signed)
Epidural Patient location during procedure: OB Start time: 09/30/2011 8:16 PM  Staffing Anesthesiologist: Brayton Caves R Performed by: anesthesiologist   Preanesthetic Checklist Completed: patient identified, site marked, surgical consent, pre-op evaluation, timeout performed, IV checked, risks and benefits discussed and monitors and equipment checked  Epidural Patient position: sitting Prep: site prepped and draped and DuraPrep Patient monitoring: continuous pulse ox and blood pressure Approach: midline Injection technique: LOR air and LOR saline  Needle:  Needle type: Tuohy  Needle gauge: 17 G Needle length: 9 cm Needle insertion depth: 9 cm Catheter type: closed end flexible Catheter size: 19 Gauge Catheter at skin depth: 15 cm Test dose: negative  Assessment Events: blood not aspirated, injection not painful, no injection resistance, negative IV test and no paresthesia  Additional Notes Patient identified.  Risk benefits discussed including failed block, incomplete pain control, headache, nerve damage, paralysis, blood pressure changes, nausea, vomiting, reactions to medication both toxic or allergic, and postpartum back pain.  Patient expressed understanding and wished to proceed.  All questions were answered.  Sterile technique used throughout procedure and epidural site dressed with sterile barrier dressing. No paresthesia or other complications noted.The patient did not experience any signs of intravascular injection such as tinnitus or metallic taste in mouth nor signs of intrathecal spread such as rapid motor block. Please see nursing notes for vital signs.

## 2011-09-30 NOTE — MAU Note (Signed)
Patient states she is having contractions every 10 minutes, denies any bleeding or leaking.

## 2011-09-30 NOTE — H&P (Signed)
Stephanie Dixon is a 41 y.o. female presenting for UC's. Maternal Medical History:  Reason for admission: Reason for admission: contractions.  41 yo G11 [id number].  EDC 10-14-11.  + HIV, with undetectable CD4 counts.  Presents in active labor.  Contractions: Onset was 3-5 hours ago.   Frequency: regular.    Fetal activity: Perceived fetal activity is normal.   Last perceived fetal movement was within the past hour.    Prenatal complications: HIV.   Prenatal Complications - Diabetes: type 2. Diabetes is managed by oral agent (dual therapy).      OB History    Grav Para Term Preterm Abortions TAB SAB Ect Mult Living   11 7 7  0 3 3 0 0 1 8     Past Medical History  Diagnosis Date  . Hypertension   . Diabetes mellitus   . HIV (human immunodeficiency virus infection)    Past Surgical History  Procedure Date  . Cesarean section   . Induced abortion    Family History: family history is not on file. Social History:  reports that she has quit smoking. She has never used smokeless tobacco. She reports that she does not drink alcohol or use illicit drugs.   Prenatal Transfer Tool  Maternal Diabetes: Yes:  Diabetes Type:  Pre-pregnancy Genetic Screening:   AMA.  Received Genetic Counseling Maternal Ultrasounds/Referrals: Normal Fetal Ultrasounds or other Referrals:  None Maternal Substance Abuse:  No Significant Maternal Medications:  Meds include: Other: see prenatal record Significant Maternal Lab Results:  Lab values include: Group B Strep positive Other Comments:  None  Review of Systems  All other systems reviewed and are negative.      Blood pressure 110/87, pulse 106, temperature 98.1 F (36.7 C), temperature source Oral, resp. rate 20, last menstrual period 01/07/2011, SpO2 99.00%. Maternal Exam:  Uterine Assessment: Contraction strength is firm.  Contraction frequency is regular.   Abdomen: Patient reports no abdominal tenderness. Cervix: Cervix evaluated by  digital exam.     Physical Exam  Nursing note and vitals reviewed. Constitutional: She is oriented to person, place, and time. She appears well-developed and well-nourished.  HENT:  Head: Normocephalic and atraumatic.  Eyes: Conjunctivae are normal. Pupils are equal, round, and reactive to light.  Neck: Normal range of motion. Neck supple.  Cardiovascular: Normal rate and regular rhythm.   Respiratory: Effort normal.  GI: Soft.  Musculoskeletal: Normal range of motion.  Neurological: She is alert and oriented to person, place, and time.  Skin: Skin is warm and dry.  Psychiatric: She has a normal mood and affect. Her behavior is normal. Judgment and thought content normal.    Prenatal labs: ABO, Rh:   Antibody:   Rubella:   RPR: NON REAC (01/24 1122)  HBsAg: NEGATIVE (01/24 1122)  HIV:    GBS:     Assessment/Plan: 38 weeks.  + HIV.  CD4 counts undetectable.  Expectant management.  Risks, complications and benefits of C/S vs. Vaginal delivery with + HIV and undetectable viral load explained, and patient agrees to vaginal delivery.   Stephanie Dixon A 09/30/2011, 6:16 PM

## 2011-10-01 ENCOUNTER — Encounter (HOSPITAL_COMMUNITY): Payer: Self-pay | Admitting: *Deleted

## 2011-10-01 LAB — CBC
HCT: 34.4 % — ABNORMAL LOW (ref 36.0–46.0)
Hemoglobin: 11.6 g/dL — ABNORMAL LOW (ref 12.0–15.0)
Platelets: 153 10*3/uL (ref 150–400)
RBC: 3.7 MIL/uL — ABNORMAL LOW (ref 3.87–5.11)
RBC: 3.64 MIL/uL — ABNORMAL LOW (ref 3.87–5.11)
RDW: 14.9 % (ref 11.5–15.5)
WBC: 9.2 10*3/uL (ref 4.0–10.5)

## 2011-10-01 MED ORDER — GLYBURIDE 2.5 MG PO TABS
2.5000 mg | ORAL_TABLET | Freq: Every day | ORAL | Status: DC
  Administered 2011-10-01 – 2011-10-03 (×3): 2.5 mg via ORAL
  Filled 2011-10-01 (×4): qty 1

## 2011-10-01 MED ORDER — ONDANSETRON HCL 4 MG PO TABS: 4.0000 mg | ORAL_TABLET | ORAL | Status: DC | PRN

## 2011-10-01 MED ORDER — IBUPROFEN 600 MG PO TABS
600.0000 mg | ORAL_TABLET | Freq: Four times a day (QID) | ORAL | Status: DC
  Administered 2011-10-01 – 2011-10-03 (×8): 600 mg via ORAL
  Filled 2011-10-01 (×10): qty 1

## 2011-10-01 MED ORDER — GLYBURIDE 5 MG PO TABS
5.0000 mg | ORAL_TABLET | Freq: Every day | ORAL | Status: DC
  Administered 2011-10-01 – 2011-10-02 (×2): 5 mg via ORAL
  Filled 2011-10-01 (×3): qty 1

## 2011-10-01 MED ORDER — OXYTOCIN 40 UNITS IN LACTATED RINGERS INFUSION - SIMPLE MED: 62.5000 mL/h | INTRAVENOUS | Status: DC | PRN

## 2011-10-01 MED ORDER — EMTRICITABINE-TENOFOVIR DF 200-300 MG PO TABS
1.0000 | ORAL_TABLET | Freq: Every day | ORAL | Status: DC
  Administered 2011-10-02 – 2011-10-03 (×2): 1 via ORAL
  Filled 2011-10-01 (×4): qty 1

## 2011-10-01 MED ORDER — TETANUS-DIPHTH-ACELL PERTUSSIS 5-2.5-18.5 LF-MCG/0.5 IM SUSP
0.5000 mL | Freq: Once | INTRAMUSCULAR | Status: AC
  Administered 2011-10-02: 0.5 mL via INTRAMUSCULAR
  Filled 2011-10-01: qty 0.5

## 2011-10-01 MED ORDER — SIMETHICONE 80 MG PO CHEW: 80.0000 mg | CHEWABLE_TABLET | ORAL | Status: DC | PRN

## 2011-10-01 MED ORDER — PRENATAL MULTIVITAMIN CH
1.0000 | ORAL_TABLET | Freq: Every day | ORAL | Status: DC
  Administered 2011-10-01 – 2011-10-03 (×3): 1 via ORAL
  Filled 2011-10-01 (×3): qty 1

## 2011-10-01 MED ORDER — SENNOSIDES-DOCUSATE SODIUM 8.6-50 MG PO TABS
2.0000 | ORAL_TABLET | Freq: Every day | ORAL | Status: DC
  Administered 2011-10-01: 2 via ORAL

## 2011-10-01 MED ORDER — LANOLIN HYDROUS EX OINT: TOPICAL_OINTMENT | CUTANEOUS | Status: DC | PRN

## 2011-10-01 MED ORDER — RITONAVIR 100 MG PO TABS
100.0000 mg | ORAL_TABLET | Freq: Two times a day (BID) | ORAL | Status: DC
  Administered 2011-10-01 – 2011-10-03 (×4): 100 mg via ORAL
  Filled 2011-10-01 (×7): qty 1

## 2011-10-01 MED ORDER — DARUNAVIR ETHANOLATE 600 MG PO TABS
600.0000 mg | ORAL_TABLET | Freq: Two times a day (BID) | ORAL | Status: DC
  Administered 2011-10-01 – 2011-10-03 (×4): 600 mg via ORAL
  Filled 2011-10-01: qty 1

## 2011-10-01 MED ORDER — DIPHENHYDRAMINE HCL 25 MG PO CAPS: 25.0000 mg | ORAL_CAPSULE | Freq: Four times a day (QID) | ORAL | Status: DC | PRN

## 2011-10-01 MED ORDER — ONDANSETRON HCL 4 MG/2ML IJ SOLN: 4.0000 mg | INTRAMUSCULAR | Status: DC | PRN

## 2011-10-01 MED ORDER — BENZOCAINE-MENTHOL 20-0.5 % EX AERO
1.0000 "application " | INHALATION_SPRAY | CUTANEOUS | Status: DC | PRN
  Administered 2011-10-01: 1 via TOPICAL
  Filled 2011-10-01: qty 56

## 2011-10-01 MED ORDER — OXYCODONE-ACETAMINOPHEN 5-325 MG PO TABS
1.0000 | ORAL_TABLET | ORAL | Status: DC | PRN
  Administered 2011-10-01 – 2011-10-02 (×4): 1 via ORAL
  Filled 2011-10-01 (×4): qty 1

## 2011-10-01 MED ORDER — DIBUCAINE 1 % RE OINT: 1.0000 "application " | TOPICAL_OINTMENT | RECTAL | Status: DC | PRN

## 2011-10-01 MED ORDER — WITCH HAZEL-GLYCERIN EX PADS: 1.0000 "application " | MEDICATED_PAD | CUTANEOUS | Status: DC | PRN

## 2011-10-01 MED ORDER — ZOLPIDEM TARTRATE 5 MG PO TABS: 5.0000 mg | ORAL_TABLET | Freq: Every evening | ORAL | Status: DC | PRN

## 2011-10-01 NOTE — Progress Notes (Signed)
Ur chart review completed.  

## 2011-10-01 NOTE — Progress Notes (Signed)
Post Partum Day 0 Subjective: no complaints  Objective: Blood pressure 124/73, pulse 84, temperature 98.2 F (36.8 C), temperature source Oral, resp. rate 20, height 5\' 4"  (1.626 m), weight 136.986 kg (302 lb), last menstrual period 01/07/2011, SpO2 99.00%, unknown if currently breastfeeding.  Physical Exam:  General: alert and no distress Lochia: appropriate Uterine Fundus: firm Incision: healing well DVT Evaluation: No evidence of DVT seen on physical exam.   Basename 10/01/11 0530 10/01/11 0135  HGB 11.6* 11.7*  HCT 33.7* 34.4*    Assessment/Plan: Plan for discharge tomorrow   LOS: 1 day   Blayke Pinera A 10/01/2011, 6:41 AM

## 2011-10-02 NOTE — Anesthesia Postprocedure Evaluation (Signed)
Anesthesia Post Note  Patient: Stephanie Dixon  Procedure(s) Performed: * No procedures listed *  Anesthesia type: Epidural  Patient location: Mother/Baby  Post pain: Pain level controlled  Post assessment: Post-op Vital signs reviewed  Last Vitals:  Filed Vitals:   10/02/11 0602  BP: 117/73  Pulse: 80  Temp: 36.8 C  Resp: 20    Post vital signs: Reviewed  Level of consciousness:alert  Complications: No apparent anesthesia complications

## 2011-10-02 NOTE — Progress Notes (Signed)
Post Partum Day 1 Subjective: no complaints  Objective: Blood pressure 117/73, pulse 80, temperature 98.2 F (36.8 C), temperature source Oral, resp. rate 20, height 5\' 4"  (1.626 m), weight 136.986 kg (302 lb), last menstrual period 01/07/2011, SpO2 99.00%, unknown if currently breastfeeding.  Physical Exam:  General: alert and no distress Lochia: appropriate Uterine Fundus: firm Incision: healing well DVT Evaluation: No evidence of DVT seen on physical exam.   Basename 10/01/11 0530 10/01/11 0135  HGB 11.6* 11.7*  HCT 33.7* 34.4*    Assessment/Plan: Plan for discharge tomorrow   LOS: 2 days   Stephanie Dixon A 10/02/2011, 9:27 AM

## 2011-10-02 NOTE — Clinical Social Work Maternal (Signed)
Clinical Social Work Department PSYCHOSOCIAL ASSESSMENT - MATERNAL/CHILD 10/02/2011  Patient:  Stephanie Dixon, Stephanie Dixon  Account Number:  [id number]  Admit Date:  09/30/2011  Marjo Bicker Name:   Carloyn Manner    Clinical Social Worker:  Andy Gauss   Date/Time:  10/01/2011 12:30 PM  Date Referred:  10/02/2011   Referral source  CN     Referred reason  O42  Substance Abuse   Other referral source:    I:  FAMILY / HOME ENVIRONMENT Child's legal guardian:  PARENT  Guardian - Name Guardian - Age Guardian - Address  Stephanie Dixon [address] [place], [place] [address]  Stephanie Dixon 50 (same as above)   Other household support members/support persons Name Relationship DOB   SON 04/24/2002   DAUGHTER 10/28/1996   DAUGHTER 05/18/1992   DAUGHTER 06/18/1990   DAUGHTER 05/15/1989   DAUGHTER 06/10/1988   Other support:   Family    II  PSYCHOSOCIAL DATA Information Source:  Patient Interview  Surveyor, quantity and Community Resources Employment:   Financial resources:  Medicaid If Medicaid - County:  [place] Clinical biochemist  WIC  Section 8   School / Grade:   Maternity Care Coordinator / Child Services Coordination / Early Interventions:   Yes  Cultural issues impacting care:    III  STRENGTHS Strengths  Adequate Resources  Home prepared for Child (including basic supplies)  Supportive family/friends   Strength comment:    IV  RISK FACTORS AND CURRENT PROBLEMS Current Problem:  YES   Risk Factor & Current Problem Patient Issue Family Issue Risk Factor / Current Problem Comment  Substance Abuse Y N Hx of MJ use  Other - See comment Y N o42    V  SOCIAL WORK ASSESSMENT Sw met with pt to assess her current social situation and offer resources if needed.  Pt lives with her spouse and older daughter.  She relocated to the area 1 year ago from [place], [place].  Pt learned about positive o42 status during the pregnancy in January.  Pt states  she was shocked by diagnoses but happy that she found out when she did.  She denies any depression, as it relates to the diagnoses and seems to have a positive attitude.  She spoke about educating herself about the illness and taking her medication regularly.  She is receiving treatment at the [facility] clinic.  Pt has disclose diagnoses to her older daughters and FOB, who are supportive, as per pt.  FOB is not positive.  Pt's mother is not aware of diagnoses and she would like to keep information confidential.  Pt is aware of the resources available to her through [facility], as she met with a counselor when she was diagnosed.  She is not interested in counseling at this time.  Pt has all the necessary supplies for the infant. Sw collected information to schedule an ID appointment for the infant and will follow up with appointment dates/time. Sw informed pt of drug screen policy.  UDS is negative, meconium results are pending.  Pt appears to be appropriate and attentive to the infant.      VI SOCIAL WORK PLAN Social Work Plan  No Further Intervention Required / No Barriers to Discharge  Information/Referral to [facility]   Type of pt/family education:   If child protective services report - county:   If child protective services report - date:   Information/referral to community resources comment:   [facility]  ID clinic   Other social work plan:

## 2011-10-03 MED ORDER — IBUPROFEN 600 MG PO TABS: 600.0000 mg | ORAL_TABLET | Freq: Four times a day (QID) | ORAL | Status: DC

## 2011-10-03 MED ORDER — OXYCODONE-ACETAMINOPHEN 5-325 MG PO TABS: 1.0000 | ORAL_TABLET | ORAL | Status: AC | PRN

## 2011-10-03 NOTE — Progress Notes (Signed)
Post Partum Day 2 Subjective: no complaints  Objective: Blood pressure 115/79, pulse 73, temperature 98.4 F (36.9 C), temperature source Oral, resp. rate 18, height 5\' 4"  (1.626 m), weight 136.986 kg (302 lb), last menstrual period 01/07/2011, SpO2 99.00%, unknown if currently breastfeeding.  Physical Exam:  General: alert and no distress Lochia: appropriate Uterine Fundus: firm Incision: healing well DVT Evaluation: No evidence of DVT seen on physical exam.   Basename 10/01/11 0530 10/01/11 0135  HGB 11.6* 11.7*  HCT 33.7* 34.4*    Assessment/Plan: Discharge home   LOS: 3 days   Stephanie Dixon A 10/03/2011, 12:34 PM

## 2011-10-03 NOTE — Discharge Summary (Signed)
Obstetric Discharge Summary Reason for Admission: onset of labor Prenatal Procedures: ultrasound Intrapartum Procedures: spontaneous vaginal delivery Postpartum Procedures: none Complications-Operative and Postpartum: none Hemoglobin  Date Value Range Status  10/01/2011 11.6* 12.0 - 15.0 g/dL Final     HCT  Date Value Range Status  10/01/2011 33.7* 36.0 - 46.0 % Final    Physical Exam:  General: alert and no distress Lochia: appropriate Uterine Fundus: firm Incision: healing well DVT Evaluation: No evidence of DVT seen on physical exam.  Discharge Diagnoses: Term Pregnancy-delivered  Discharge Information: Date: 10/03/2011 Activity: pelvic rest Diet: routine Medications: PNV, Ibuprofen, Colace, Percocet and antiretroviral medicines Condition: stable Instructions: refer to practice specific booklet Discharge to: home Follow-up Information    Follow up with Antionette Char A, MD. Schedule an appointment as soon as possible for a visit in 6 weeks.   Contact information:   [address] [place] [address] [phone]          Newborn Data: Live born female  Birth Weight: 8 lb 5.7 oz (3790 g) APGAR: 7, 9  Home with mother.  Yareth Kearse A 10/03/2011, 12:41 PM

## 2011-10-04 LAB — TYPE AND SCREEN
Unit division: 0
Unit division: 0

## 2011-10-09 ENCOUNTER — Other Ambulatory Visit: Payer: Self-pay | Admitting: Internal Medicine

## 2011-11-05 ENCOUNTER — Encounter: Payer: Self-pay | Admitting: Internal Medicine

## 2011-11-05 ENCOUNTER — Ambulatory Visit (INDEPENDENT_AMBULATORY_CARE_PROVIDER_SITE_OTHER): Payer: Medicaid Other | Admitting: Internal Medicine

## 2011-11-05 VITALS — BP 146/96 | HR 76 | Temp 98.8°F | Wt 280.0 lb

## 2011-11-05 DIAGNOSIS — B20 Human immunodeficiency virus [HIV] disease: Secondary | ICD-10-CM

## 2011-11-05 DIAGNOSIS — Z21 Asymptomatic human immunodeficiency virus [HIV] infection status: Secondary | ICD-10-CM

## 2011-11-05 MED ORDER — ELVITEG-COBIC-EMTRICIT-TENOFDF 150-150-200-300 MG PO TABS: 1.0000 | ORAL_TABLET | Freq: Every day | ORAL | Status: DC

## 2011-11-05 NOTE — Progress Notes (Signed)
HIV CLINIC NOTE  RFV: routine follow up Subjective:    Patient ID: Stephanie Dixon, female    DOB: June 12, 1970, 41 y.o.   MRN: [id number]  HPI [place] is a 41yo F with HIV, diagnosed in setting of pregnancy, CD 4 count 240 / VL <20, on truvada, darunavir/rit. She has missed 3 doses in non-consecutive days since her daughter, Marcelino Duster, was born on July 10th, 2013. Her baby thus far tested negative, finished AZT. She states that she would prefer to switch to a single drug regimen.  Current Outpatient Prescriptions on File Prior to Visit  Medication Sig Dispense Refill  . emtricitabine-tenofovir (TRUVADA) 200-300 MG per tablet Take 1 tablet by mouth daily.  30 tablet  5  . glyBURIDE (DIABETA) 2.5 MG tablet Take 2.5-5 mg by mouth 2 (two) times daily with a meal. Take 2.5mg  with breakfast & 5mg  at bedtime      . NORVIR 100 MG TABS TAKE 1 TABLET (100 MG TOTAL) BY MOUTH 2 (TWO) TIMES DAILY WITH A MEAL.  60 tablet  0  . Prenatal Vit-Fe Fumarate-FA (PRENATAL MULTIVITAMIN) TABS Take 1 tablet by mouth daily.      Marland Kitchen PREZISTA 600 MG tablet TAKE 1 TABLET (600 MG TOTAL) BY MOUTH 2 (TWO) TIMES DAILY WITH A MEAL.  60 tablet  0  . ranitidine (ZANTAC) 150 MG tablet Take 1 tablet (150 mg total) by mouth 2 (two) times daily as needed for heartburn.  60 tablet  5  . DISCONTD: ibuprofen (ADVIL,MOTRIN) 600 MG tablet Take 1 tablet (600 mg total) by mouth every 6 (six) hours.  30 tablet  5   Active Ambulatory Problems    Diagnosis Date Noted  . HIV (human immunodeficiency virus infection) 04/17/2011  . Advanced maternal age risk, currently pregnant 04/17/2011  . Gestational diabetes 04/17/2011  . Hypertension 04/17/2011  . Obesity 04/17/2011  . Twin liveborn born in hospital by C-section 04/17/2011   Resolved Ambulatory Problems    Diagnosis Date Noted  . Vitamin d deficiency 04/17/2011   Past Medical History  Diagnosis Date  . Diabetes mellitus    Social hx = has 2 daughters living with her now, all  with recent newborns. Thus 3 babes< 6 months in the household. No smoking. No drinking   Review of Systems  Constitutional: Negative for fever, chills, diaphoresis, activity change, appetite change, fatigue and unexpected weight change.  HENT: Negative for congestion, sore throat, rhinorrhea, sneezing, trouble swallowing and sinus pressure.  Eyes: Negative for photophobia and visual disturbance.  Respiratory: Negative for cough, chest tightness, shortness of breath, wheezing and stridor.  Cardiovascular: Negative for chest pain, palpitations and leg swelling.  Gastrointestinal: Negative for nausea, vomiting, abdominal pain, diarrhea, constipation, blood in stool, abdominal distention and anal bleeding.  Genitourinary: Negative for dysuria, hematuria, flank pain and difficulty urinating.  Musculoskeletal: Negative for myalgias, back pain, joint swelling, arthralgias and gait problem.  Skin: Negative for color change, pallor, rash and wound.  Neurological: Negative for dizziness, tremors, weakness and light-headedness.  Hematological: Negative for adenopathy. Does not bruise/bleed easily.  Psychiatric/Behavioral: Negative for behavioral problems, confusion, sleep disturbance, dysphoric mood, decreased concentration and agitation.       Objective:   Physical Exam BP 146/96  Pulse 76  Temp 98.8 F (37.1 C) (Oral)  Wt 280 lb (127.007 kg)  Breastfeeding? No Physical Exam  Constitutional:  oriented to person, place, and time. appears well-developed and well-nourished. No distress.  HENT:  Mouth/Throat: Oropharynx is clear and moist. No oropharyngeal exudate.  Cardiovascular: Normal rate, regular rhythm and normal heart sounds. Exam reveals no gallop and no friction rub.  No murmur heard.  Pulmonary/Chest: Effort normal and breath sounds normal. No respiratory distress. He has no wheezes.  Abdominal: Soft. Bowel sounds are normal.  exhibits no distension. There is no tenderness.    Lymphadenopathy:  no cervical adenopathy.  Skin: Skin is warm and dry. No rash noted. No erythema.      Assessment & Plan:     hiv = will switch to stribild to one pill a day formulation. Can discontinue darunavir, truvada, and ritonavir  New pharmacy will be [facility] to do home delivery.  rtc in 2 months, for flu vax and labs. No labs today

## 2011-11-11 ENCOUNTER — Ambulatory Visit: Payer: Medicaid Other | Admitting: Dietician

## 2011-12-24 ENCOUNTER — Other Ambulatory Visit: Payer: Medicaid Other

## 2011-12-25 ENCOUNTER — Other Ambulatory Visit: Payer: Medicaid Other

## 2011-12-31 ENCOUNTER — Encounter: Payer: Self-pay | Admitting: Internal Medicine

## 2011-12-31 NOTE — Progress Notes (Signed)
HIV CLINIC VISIT  RFV: routine follow up Subjective:    Patient ID: Stephanie Dixon, female    DOB: Apr 29, 1970, 41 y.o.   MRN: [id number]  HPI Stephanie Dixon is a 55 yo Fwith newly diagnosed HIV in setting of pregnancy, CD 4 count of 230(21%)/VL<20 currently on truvadaDRVr. At [redacted] wks gestation. She is doing well. Managing her elevated BS with diet and taking glucophage. She also has occ spikes in elevated BP but she is being followed at her OB/gyn. She has her daughters and grand-daughter staying with her presently. She is sleeping well, reflux has resolved.  No difficulty with meds. Not missed a dose. No rash, no headache, no n/v/diarrhea. She has questions about what are the next steps, with childbirth, what happens with her infant.  Current Outpatient Prescriptions on File Prior to Visit  Medication Sig Dispense Refill  . emtricitabine-tenofovir (TRUVADA) 200-300 MG per tablet Take 1 tablet by mouth daily.  30 tablet  5  . Prenatal Vit-Fe Fumarate-FA (PRENATAL MULTIVITAMIN) TABS Take 1 tablet by mouth daily.      . ranitidine (ZANTAC) 150 MG tablet Take 1 tablet (150 mg total) by mouth 2 (two) times daily as needed for heartburn.  60 tablet  5  . glyBURIDE (DIABETA) 2.5 MG tablet Take 2.5-5 mg by mouth 2 (two) times daily with a meal. Take 2.5mg  with breakfast & 5mg  at bedtime      . ibuprofen (ADVIL,MOTRIN) 600 MG tablet Take 600 mg by mouth every 6 (six) hours.       Active Ambulatory Problems    Diagnosis Date Noted  . HIV (human immunodeficiency virus infection) 04/17/2011  . Advanced maternal age risk, currently pregnant 04/17/2011  . Gestational diabetes 04/17/2011  . Hypertension 04/17/2011  . Obesity 04/17/2011  . Twin liveborn born in hospital by C-section 04/17/2011   Resolved Ambulatory Problems    Diagnosis Date Noted  . Vitamin d deficiency 04/17/2011   Past Medical History  Diagnosis Date  . Diabetes mellitus        Review of Systems Please see hpi for positive  and negative pertinents; 10 point ROS reviewed, non-contributory    Objective:   Physical Exam BP 126/86  Pulse 89  Temp 98.3 F (36.8 C) (Oral)  Ht 5\' 4"  (1.626 m)  Wt 297 lb (134.718 kg)  BMI 50.98 kg/m2  LMP 01/07/2011  Breastfeeding? No Physical Exam  Constitutional:  oriented to person, place, and time. appears well-developed, well-nourished, gravid. No distress.  HENT:  Mouth/Throat: Oropharynx is clear and moist. No oropharyngeal exudate.  Cardiovascular: Normal rate, regular rhythm and normal heart sounds. Exam reveals no gallop and no friction rub.  No murmur heard.  Pulmonary/Chest: Effort normal and breath sounds normal. No respiratory distress. no wheezes.  Abdominal: Soft. Bowel sounds are normal. Gravid abdomena. There is no tenderness.  Lymphadenopathy:  no cervical adenopathy.   Skin: Skin is warm and dry. No rash noted. No erythema.  Psychiatric: normal mood and affect. behavior is normal.       Assessment & Plan:  HIV =continue with her excellent adherence. She is doing great and has good virologic control. Can change to a one-pill regimen once she delivers  Elevated BP = recommended that she speaks with OB to see if she meets criteria for anti-hypertensives  rtc in 4 wks

## 2012-01-08 ENCOUNTER — Encounter: Payer: Self-pay | Admitting: Internal Medicine

## 2012-01-08 ENCOUNTER — Ambulatory Visit (INDEPENDENT_AMBULATORY_CARE_PROVIDER_SITE_OTHER): Payer: Medicaid Other | Admitting: Internal Medicine

## 2012-01-08 VITALS — BP 128/71 | HR 73 | Temp 97.1°F | Wt 284.0 lb

## 2012-01-08 DIAGNOSIS — Z21 Asymptomatic human immunodeficiency virus [HIV] infection status: Secondary | ICD-10-CM

## 2012-01-08 DIAGNOSIS — B20 Human immunodeficiency virus [HIV] disease: Secondary | ICD-10-CM

## 2012-01-08 DIAGNOSIS — Z23 Encounter for immunization: Secondary | ICD-10-CM

## 2012-01-08 LAB — CBC WITH DIFFERENTIAL/PLATELET
Eosinophils Absolute: 0.1 10*3/uL (ref 0.0–0.7)
Eosinophils Relative: 3 % (ref 0–5)
Lymphs Abs: 1.8 10*3/uL (ref 0.7–4.0)
MCH: 32.7 pg (ref 26.0–34.0)
MCV: 93.9 fL (ref 78.0–100.0)
Monocytes Absolute: 0.4 10*3/uL (ref 0.1–1.0)
Platelets: 233 10*3/uL (ref 150–400)
RBC: 3.92 MIL/uL (ref 3.87–5.11)
RDW: 14.4 % (ref 11.5–15.5)

## 2012-01-08 NOTE — Progress Notes (Signed)
HIV CLINIC NOTE  RFV: routine visit  Subjective:    Patient ID: Stephanie Dixon, female    DOB: 1970/04/01, 41 y.o.   MRN: [id number]  HPI 41yo Female, post partum, in clinic with 72 month old daughter. Stephanie Dixon's most recent labs :Cd 4 count  240/VL <20, on stribild. She reports good adherence. Not missing a dose since being on a single pill regimne. She states that she is feeling stress/depression, really stretched financially feels very alone without her family. Now 67 months old daughter, Stephanie Dixon, doing well in her development, weighs 13lb , and all blood work is negative. Next follow up at 38 month old check up at [facility].  Tearful, feeling alone since her mother and 2 adult daughters are up in [place]. Her younger children (teenagers/young adults) are with her in [place]. She is feeling alone since not many friends. She is not used to be home and not working. Just doing "baby 24/7". She has not thoughts of harming herself or her infant as she has mentioned without prompting. Wishes to be involved in something other than being home alone.It is difficult for her not being busy.  Current Outpatient Prescriptions on File Prior to Visit  Medication Sig Dispense Refill  . elvitegravir-cobicistat-emtricitabine-tenofovir (STRIBILD) 150-150-200-300 MG TABS Take 1 tablet by mouth daily with breakfast.  30 tablet  11  . emtricitabine-tenofovir (TRUVADA) 200-300 MG per tablet Take 1 tablet by mouth daily.  30 tablet  5  . glyBURIDE (DIABETA) 2.5 MG tablet Take 2.5-5 mg by mouth 2 (two) times daily with a meal. Take 2.5mg  with breakfast & 5mg  at bedtime      . ibuprofen (ADVIL,MOTRIN) 600 MG tablet Take 600 mg by mouth every 6 (six) hours.      . NORVIR 100 MG TABS TAKE 1 TABLET (100 MG TOTAL) BY MOUTH 2 (TWO) TIMES DAILY WITH A MEAL.  60 tablet  0  . Prenatal Vit-Fe Fumarate-FA (PRENATAL MULTIVITAMIN) TABS Take 1 tablet by mouth daily.      Marland Kitchen PREZISTA 600 MG tablet TAKE 1 TABLET (600  MG TOTAL) BY MOUTH 2 (TWO) TIMES DAILY WITH A MEAL.  60 tablet  0  . ranitidine (ZANTAC) 150 MG tablet Take 1 tablet (150 mg total) by mouth 2 (two) times daily as needed for heartburn.  60 tablet  5   Active Ambulatory Problems    Diagnosis Date Noted  . HIV (human immunodeficiency virus infection) 04/17/2011  . Advanced maternal age risk, currently pregnant 04/17/2011  . Gestational diabetes 04/17/2011  . Hypertension 04/17/2011  . Obesity 04/17/2011  . Twin liveborn born in hospital by C-section 04/17/2011   Resolved Ambulatory Problems    Diagnosis Date Noted  . Vitamin d deficiency 04/17/2011   Past Medical History  Diagnosis Date  . Diabetes mellitus      Review of Systems     Objective:   Physical Exam BP 128/71  Pulse 73  Temp 97.1 F (36.2 C) (Oral)  Wt 284 lb (128.822 kg)  LMP 01/01/2012  Breastfeeding? No Physical Exam  Constitutional: oriented to person, place, and time. well-developed and well-nourished. Tearful and sad during this visit Cardiovascular: Normal rate, regular rhythm and normal heart sounds. Exam reveals no gallop and no friction rub.  No murmur heard.  Pulmonary/Chest: Effort normal and breath sounds normal. No respiratory distress.  no wheezes.  Lymphadenopathy:  no cervical adenopathy.  Skin: Skin is warm and dry. No rash noted. No erythema.  Psychiatric: tearful  Assessment & Plan:  Depression = she doesn't feel this is post-partum since some of these feelings came on during pregnancy. Have referred her to counseling plus [facility] to re-establish case manager. Will try to find community support groups  HIV=doing well with her medications; will check her labs today inc. Cd 4 count and viral load  Health maintenance = flu vaccine today  rtc in 2 wks

## 2012-01-09 LAB — COMPREHENSIVE METABOLIC PANEL
ALT: 11 U/L (ref 0–35)
BUN: 11 mg/dL (ref 6–23)
CO2: 27 mEq/L (ref 19–32)
Creat: 0.81 mg/dL (ref 0.50–1.10)
Total Bilirubin: 0.7 mg/dL (ref 0.3–1.2)

## 2012-01-09 LAB — T-HELPER CELL (CD4) - (RCID CLINIC ONLY)
CD4 % Helper T Cell: 26 % — ABNORMAL LOW (ref 33–55)
CD4 T Cell Abs: 410 uL (ref 400–2700)

## 2012-01-09 LAB — HIV-1 RNA QUANT-NO REFLEX-BLD
HIV 1 RNA Quant: <20 copies/mL (ref <20)
HIV-1 RNA Quant, Log: <1.3 {Log} (ref <1.30)

## 2012-02-26 ENCOUNTER — Ambulatory Visit (INDEPENDENT_AMBULATORY_CARE_PROVIDER_SITE_OTHER): Payer: Medicaid Other | Admitting: Internal Medicine

## 2012-02-26 ENCOUNTER — Encounter: Payer: Self-pay | Admitting: Internal Medicine

## 2012-02-26 VITALS — Temp 98.7°F | Ht 65.0 in | Wt 285.0 lb

## 2012-02-26 DIAGNOSIS — B20 Human immunodeficiency virus [HIV] disease: Secondary | ICD-10-CM

## 2012-02-26 DIAGNOSIS — Z21 Asymptomatic human immunodeficiency virus [HIV] infection status: Secondary | ICD-10-CM

## 2012-02-26 NOTE — Progress Notes (Signed)
HIV CLINIC NOTE  RFV: routine follow up Subjective:    Patient ID: Stephanie Dixon, female    DOB: [birth year], 41 y.o.   MRN: [id number]  HPI [name] is a 41 yo F with HIV, CD 4 count of 410/ VL < 20, in oct 2013, currently on stribild, has missed 3 pills since the last appt. Her baby is now 4 months. Doing well. C/ post partum depression. Now improved since her daughter and sister are in town.  She had a few volunteering for [employer] to help. Still looking for a job.   Current Outpatient Prescriptions on File Prior to Visit  Medication Sig Dispense Refill  . elvitegravir-cobicistat-emtricitabine-tenofovir (STRIBILD) 150-150-200-300 MG TABS Take 1 tablet by mouth daily with breakfast.  30 tablet  11  . ibuprofen (ADVIL,MOTRIN) 600 MG tablet Take 600 mg by mouth every 6 (six) hours.      . ranitidine (ZANTAC) 150 MG tablet Take 1 tablet (150 mg total) by mouth 2 (two) times daily as needed for heartburn.  60 tablet  5  . glyBURIDE (DIABETA) 2.5 MG tablet Take 2.5-5 mg by mouth 2 (two) times daily with a meal. Take 2.5mg  with breakfast & 5mg  at bedtime      . Prenatal Vit-Fe Fumarate-FA (PRENATAL MULTIVITAMIN) TABS Take 1 tablet by mouth daily.       Active Ambulatory Problems    Diagnosis Date Noted  . HIV (human immunodeficiency virus infection) 04/17/2011  . Advanced maternal age risk, currently pregnant 04/17/2011  . Gestational diabetes 04/17/2011  . Hypertension 04/17/2011  . Obesity 04/17/2011  . Twin liveborn born in hospital by C-section 04/17/2011   Resolved Ambulatory Problems    Diagnosis Date Noted  . Vitamin d deficiency 04/17/2011   Past Medical History  Diagnosis Date  . Diabetes mellitus      Review of Systems 10 point ROS negative    Objective:   Physical Exam Temp 98.7 F (37.1 C) (Oral)  Ht 5\' 5"  (1.651 m)  Wt 285 lb (129.275 kg)  BMI 47.43 kg/m2  LMP 02/21/2012  Breastfeeding? No Physical Exam  Constitutional:  oriented to person, place,  and time.  appears well-developed and well-nourished. No distress.  HENT:  Mouth/Throat: Oropharynx is clear and moist. No oropharyngeal exudate.  Cardiovascular: Normal rate, regular rhythm and normal heart sounds. Exam reveals no gallop and no friction rub.  No murmur heard.  Pulmonary/Chest: Effort normal and breath sounds normal. No respiratory distress. He has no wheezes.  Abdominal: Soft. Bowel sounds are normal.  exhibits no distension. There is no tenderness.  Lymphadenopathy:  no cervical adenopathy. Skin: Skin is warm and dry. No rash noted. No erythema.           Assessment & Plan:  HIV = continue with stribild. Clarified that she should take after some food, daily at same time.  Health maintenance = uptodate  Post partum depression= appears resolved.  rtc in 3 month

## 2012-03-29 ENCOUNTER — Telehealth: Payer: Self-pay | Admitting: *Deleted

## 2012-03-29 NOTE — Telephone Encounter (Signed)
Patient called stating she has relocated to [place] and wanted information on ID clinics.  Advised to go to the local health department and they will arrange an appointment for her at ID, she agreed to this. Wendall Mola CMA

## 2012-05-27 ENCOUNTER — Other Ambulatory Visit: Payer: Medicaid Other

## 2012-06-01 ENCOUNTER — Other Ambulatory Visit (INDEPENDENT_AMBULATORY_CARE_PROVIDER_SITE_OTHER): Payer: Medicaid Other

## 2012-06-01 DIAGNOSIS — B20 Human immunodeficiency virus [HIV] disease: Secondary | ICD-10-CM

## 2012-06-01 DIAGNOSIS — Z79899 Other long term (current) drug therapy: Secondary | ICD-10-CM

## 2012-06-02 LAB — CBC WITH DIFFERENTIAL/PLATELET
Eosinophils Absolute: 0.2 10*3/uL (ref 0.0–0.7)
Eosinophils Relative: 4 % (ref 0–5)
HCT: 39 % (ref 36.0–46.0)
Hemoglobin: 13.2 g/dL (ref 12.0–15.0)
Lymphocytes Relative: 38 % (ref 12–46)
Lymphs Abs: 2 10*3/uL (ref 0.7–4.0)
MCH: 33.2 pg (ref 26.0–34.0)
MCV: 98 fL (ref 78.0–100.0)
Monocytes Absolute: 0.3 10*3/uL (ref 0.1–1.0)
Monocytes Relative: 6 % (ref 3–12)
RBC: 3.98 MIL/uL (ref 3.87–5.11)
WBC: 5.4 10*3/uL (ref 4.0–10.5)

## 2012-06-02 LAB — LIPID PANEL
Cholesterol: 117 mg/dL (ref 0–200)
Total CHOL/HDL Ratio: 2.9 Ratio

## 2012-06-02 LAB — COMPREHENSIVE METABOLIC PANEL
CO2: 25 mEq/L (ref 19–32)
Calcium: 8.9 mg/dL (ref 8.4–10.5)
Chloride: 106 mEq/L (ref 96–112)
Creat: 0.77 mg/dL (ref 0.50–1.10)
Glucose, Bld: 122 mg/dL — ABNORMAL HIGH (ref 70–99)
Total Bilirubin: 0.9 mg/dL (ref 0.3–1.2)

## 2012-06-10 ENCOUNTER — Ambulatory Visit (INDEPENDENT_AMBULATORY_CARE_PROVIDER_SITE_OTHER): Payer: Medicaid Other | Admitting: Internal Medicine

## 2012-06-10 VITALS — BP 124/76 | HR 81 | Temp 97.7°F | Wt 297.0 lb

## 2012-06-10 DIAGNOSIS — Z21 Asymptomatic human immunodeficiency virus [HIV] infection status: Secondary | ICD-10-CM

## 2012-06-10 DIAGNOSIS — B20 Human immunodeficiency virus [HIV] disease: Secondary | ICD-10-CM

## 2012-06-10 NOTE — Progress Notes (Signed)
[facility] HIV CLINIC NOTE  RFV: routine visit Subjective:    Patient ID: Stephanie Dixon, female    DOB: 29-Feb-1971, 42 y.o.   MRN: [id number]  HPI CD 4 count of 540/VL <20  On Stribild, never missed a dose.doing well with her 8 month daughter,(HIV negative). She reports having food insecurity and financial difficulties. She has  3 month credit to her food stamp card, but still having difficult access. Now caring fro her mom. Stayed with your mom but had cold. Having difficult access to food stamp card.  Wants a counselor for situation depression. No threat of harm to herself or her child. She is just having difficulty since she is unemployed and caring for her mother.  Current Outpatient Prescriptions on File Prior to Visit  Medication Sig Dispense Refill  . elvitegravir-cobicistat-emtricitabine-tenofovir (STRIBILD) 150-150-200-300 MG TABS Take 1 tablet by mouth daily with breakfast.  30 tablet  11   No current facility-administered medications on file prior to visit.   Active Ambulatory Problems    Diagnosis Date Noted  . HIV (human immunodeficiency virus infection) 04/17/2011  . Advanced maternal age risk, currently pregnant 04/17/2011  . Gestational diabetes 04/17/2011  . Hypertension 04/17/2011  . Obesity 04/17/2011  . Twin liveborn born in hospital by C-section 04/17/2011   Resolved Ambulatory Problems    Diagnosis Date Noted  . Vitamin d deficiency 04/17/2011   Past Medical History  Diagnosis Date  . Diabetes mellitus      Review of Systems     Objective:   Physical Exam BP 124/76  Pulse 81  Temp(Src) 97.7 F (36.5 C) (Oral)  Wt 297 lb (134.718 kg)  BMI 49.42 kg/m2 Physical Exam  Constitutional:  oriented to person, place, and time. He appears well-developed and well-nourished. No distress.  HENT:  Mouth/Throat: Oropharynx is clear and moist. No oropharyngeal exudate.  Cardiovascular: Normal rate, regular rhythm and normal heart sounds. Exam reveals no gallop  and no friction rub.  No murmur heard.  Pulmonary/Chest: Effort normal and breath sounds normal. No respiratory distress.  no wheezes.  Lymphadenopathy: no cervical adenopathy.  Skin: Skin is warm and dry. No rash noted. No erythema.        Assessment & Plan:  hiv = continue with stribild  Access to community service =thp someone to help troubleshoot foodstamps and community services  Situation depression = will seek counseling with kenny  rtc in 4-6wk

## 2012-06-14 ENCOUNTER — Ambulatory Visit: Payer: Medicaid Other

## 2012-06-17 ENCOUNTER — Ambulatory Visit: Payer: Medicaid Other

## 2012-07-02 ENCOUNTER — Encounter: Payer: Self-pay | Admitting: Licensed Clinical Social Worker

## 2012-07-13 ENCOUNTER — Ambulatory Visit: Payer: Medicaid Other | Admitting: Internal Medicine

## 2012-07-28 ENCOUNTER — Ambulatory Visit (INDEPENDENT_AMBULATORY_CARE_PROVIDER_SITE_OTHER): Payer: Medicaid Other | Admitting: Internal Medicine

## 2012-07-28 ENCOUNTER — Encounter: Payer: Self-pay | Admitting: Internal Medicine

## 2012-07-28 VITALS — BP 142/93 | HR 80 | Temp 98.2°F | Wt 298.0 lb

## 2012-07-28 DIAGNOSIS — B20 Human immunodeficiency virus [HIV] disease: Secondary | ICD-10-CM

## 2012-07-28 DIAGNOSIS — R635 Abnormal weight gain: Secondary | ICD-10-CM

## 2012-07-28 NOTE — Progress Notes (Signed)
[facility] HIV CLINIC NOTE  RFV: routine Subjective:    Patient ID: Stephanie Dixon, female    DOB: Jun 18, 1970, 42 y.o.   MRN: [id number]  HPI 42yo F with HIV, dx in 2012 in setting of pregnancy, recent CD 4 count of 540/VL< 20 on stribild Concern about her weight and wants to lose, possibly via diet pills. She is doing well overall. 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 elvitegravir-cobicistat-emtricitabine-tenofovir (STRIBILD) 150-150-200-300 MG TABS Take 1 tablet by mouth daily with breakfast.  30 tablet  11   No current facility-administered medications on file prior to visit.   Active Ambulatory Problems    Diagnosis Date Noted  . HIV (human immunodeficiency virus infection) 04/17/2011  . Advanced maternal age risk, currently pregnant 04/17/2011  . Gestational diabetes 04/17/2011  . Hypertension 04/17/2011  . Obesity 04/17/2011  . Twin liveborn born in hospital by C-section 04/17/2011   Resolved Ambulatory Problems    Diagnosis Date Noted  . Vitamin d deficiency 04/17/2011   Past Medical History  Diagnosis Date  . Diabetes mellitus    Social and family hx unchanged. Her mom is still with her until june  Review of Systems 10 point Review of Systems is normal Constitutional: Negative for fever, chills, diaphoresis, activity change, appetite change, fatigue and unexpected weight change.  HENT: Negative for congestion, sore throat, rhinorrhea, sneezing, trouble swallowing and sinus pressure.  Eyes: Negative for photophobia and visual disturbance.  Respiratory: Negative for cough, chest tightness, shortness of breath, wheezing and stridor.  Cardiovascular: Negative for chest pain, palpitations and leg swelling.  Gastrointestinal: Negative for nausea, vomiting, abdominal  pain, diarrhea, constipation, blood in stool, abdominal distention and anal bleeding.  Genitourinary: Negative for dysuria, hematuria, flank pain and difficulty urinating.  Musculoskeletal: Negative for myalgias, back pain, joint swelling, arthralgias and gait problem.  Skin: Negative for color change, pallor, rash and wound.  Neurological: Negative for dizziness, tremors, weakness and light-headedness.  Hematological: Negative for adenopathy. Does not bruise/bleed easily.  Psychiatric/Behavioral: Negative for behavioral problems, confusion, sleep disturbance, dysphoric mood, decreased concentration and agitation.       Objective:   Physical Exam BP 142/93  Pulse 80  Temp(Src) 98.2 F (36.8 C) (Oral)  Wt 298 lb (135.172 kg)  BMI 49.59 kg/m2  LMP 07/01/2012 Physical Exam  Constitutional:  oriented to person, place, and time.  appears well-developed and well-nourished. No distress.  HENT:  Mouth/Throat: Oropharynx is clear and moist. No oropharyngeal exudate.  Cardiovascular: Normal rate, regular rhythm and normal heart sounds. Exam reveals no gallop and no friction rub.  No murmur heard.  Pulmonary/Chest: Effort normal and breath sounds normal. No respiratory distress. He has no wheezes.  Lymphadenopathy:  no cervical adenopathy.  Skin: Skin is warm and dry. No rash noted. No erythema.  Psychiatric: good mood     Assessment & Plan:  HIV = continue on stribild. Has great viral suppression  Obesity = encouraged better eating choices and increased exercise for now  rtc in 3 months

## 2012-08-06 ENCOUNTER — Telehealth: Payer: Self-pay | Admitting: *Deleted

## 2012-08-06 NOTE — Telephone Encounter (Signed)
Patient called requesting help with weight loss. Told her that Dr. Drue Second wanted her to try eating healthier and exercise for now and she said she just wants Dr. Drue Second or Feliz Beam to call her. Stephanie Dixon

## 2012-09-22 ENCOUNTER — Other Ambulatory Visit: Payer: Self-pay | Admitting: *Deleted

## 2012-09-22 DIAGNOSIS — Z113 Encounter for screening for infections with a predominantly sexual mode of transmission: Secondary | ICD-10-CM

## 2012-09-22 DIAGNOSIS — B20 Human immunodeficiency virus [HIV] disease: Secondary | ICD-10-CM

## 2012-09-22 DIAGNOSIS — Z79899 Other long term (current) drug therapy: Secondary | ICD-10-CM

## 2012-10-12 ENCOUNTER — Other Ambulatory Visit: Payer: Self-pay | Admitting: Internal Medicine

## 2012-10-27 ENCOUNTER — Other Ambulatory Visit: Payer: Medicaid Other

## 2012-11-10 ENCOUNTER — Ambulatory Visit: Payer: Medicaid Other | Admitting: Internal Medicine

## 2012-11-23 ENCOUNTER — Other Ambulatory Visit: Payer: Self-pay | Admitting: Internal Medicine

## 2012-11-23 DIAGNOSIS — B20 Human immunodeficiency virus [HIV] disease: Secondary | ICD-10-CM

## 2012-11-30 ENCOUNTER — Telehealth: Payer: Self-pay | Admitting: *Deleted

## 2012-11-30 ENCOUNTER — Ambulatory Visit: Payer: Medicaid Other | Admitting: Internal Medicine

## 2012-11-30 NOTE — Telephone Encounter (Signed)
Pt advised to re-apply for Medicaid as soon as possible.  RN advised patient to call [facility] Case Manager to assist with Juanell Fairly and [employer]AP applications.  Pt stated that she has the number for [facility].  Pt scheduled for appointment w/ Dr. Drue Second.

## 2012-12-16 ENCOUNTER — Telehealth: Payer: Self-pay | Admitting: *Deleted

## 2012-12-16 ENCOUNTER — Ambulatory Visit: Payer: Medicaid Other | Admitting: Internal Medicine

## 2012-12-16 ENCOUNTER — Ambulatory Visit: Payer: Self-pay

## 2012-12-16 NOTE — Telephone Encounter (Signed)
Called patient to try and reschedule her missed appt and had to leave a message for her to call the office asap. Will explain to her the walk in policy and advise her she is now on the list to do so since she has missed several appts.

## 2012-12-23 ENCOUNTER — Ambulatory Visit: Payer: Self-pay | Admitting: Internal Medicine

## 2012-12-28 ENCOUNTER — Other Ambulatory Visit (INDEPENDENT_AMBULATORY_CARE_PROVIDER_SITE_OTHER): Payer: Self-pay

## 2012-12-28 DIAGNOSIS — Z79899 Other long term (current) drug therapy: Secondary | ICD-10-CM

## 2012-12-28 DIAGNOSIS — Z113 Encounter for screening for infections with a predominantly sexual mode of transmission: Secondary | ICD-10-CM

## 2012-12-28 DIAGNOSIS — B20 Human immunodeficiency virus [HIV] disease: Secondary | ICD-10-CM

## 2012-12-29 LAB — LIPID PANEL
HDL: 46 mg/dL (ref >39)
LDL Cholesterol: 45 mg/dL (ref 0–99)
Total CHOL/HDL Ratio: 2.8 Ratio
Triglycerides: 178 mg/dL — ABNORMAL HIGH (ref <150)
VLDL: 36 mg/dL (ref 0–40)

## 2012-12-29 LAB — CBC WITH DIFFERENTIAL/PLATELET
Basophils Absolute: 0 10*3/uL (ref 0.0–0.1)
Basophils Relative: 1 % (ref 0–1)
Eosinophils Absolute: 0.1 10*3/uL (ref 0.0–0.7)
Eosinophils Relative: 2 % (ref 0–5)
Lymphocytes Relative: 37 % (ref 12–46)
Lymphs Abs: 1.8 10*3/uL (ref 0.7–4.0)
MCH: 33.6 pg (ref 26.0–34.0)
MCV: 99 fL (ref 78.0–100.0)
Neutrophils Relative %: 54 % (ref 43–77)
Platelets: 215 10*3/uL (ref 150–400)
RBC: 3.9 MIL/uL (ref 3.87–5.11)
RDW: 15.5 % (ref 11.5–15.5)
WBC: 5 10*3/uL (ref 4.0–10.5)

## 2012-12-29 LAB — COMPLETE METABOLIC PANEL WITH GFR
ALT: 18 U/L (ref 0–35)
AST: 19 U/L (ref 0–37)
Alkaline Phosphatase: 63 U/L (ref 39–117)
BUN: 13 mg/dL (ref 6–23)
CO2: 26 mEq/L (ref 19–32)
Calcium: 9.1 mg/dL (ref 8.4–10.5)
Chloride: 103 mEq/L (ref 96–112)
Creat: 0.78 mg/dL (ref 0.50–1.10)
GFR, Est African American: >89 mL/min
GFR, Est Non African American: >89 mL/min
Sodium: 137 mEq/L (ref 135–145)
Total Bilirubin: 0.4 mg/dL (ref 0.3–1.2)
Total Protein: 7 g/dL (ref 6.0–8.3)

## 2012-12-29 LAB — RPR

## 2012-12-29 LAB — T-HELPER CELL (CD4) - (RCID CLINIC ONLY)
CD4 % Helper T Cell: 27 % — ABNORMAL LOW (ref 33–55)
CD4 T Cell Abs: 470 /uL (ref 400–2700)

## 2012-12-30 ENCOUNTER — Other Ambulatory Visit: Payer: Self-pay | Admitting: *Deleted

## 2012-12-30 DIAGNOSIS — B20 Human immunodeficiency virus [HIV] disease: Secondary | ICD-10-CM

## 2012-12-30 LAB — HIV-1 RNA QUANT-NO REFLEX-BLD: HIV 1 RNA Quant: <20 copies/mL (ref <20)

## 2012-12-30 MED ORDER — ELVITEG-COBIC-EMTRICIT-TENOFDF 150-150-200-300 MG PO TABS: 1.0000 | ORAL_TABLET | Freq: Every day | ORAL | Status: DC

## 2013-01-18 ENCOUNTER — Ambulatory Visit: Payer: Self-pay | Admitting: Internal Medicine

## 2013-01-19 ENCOUNTER — Other Ambulatory Visit: Payer: Self-pay | Admitting: *Deleted

## 2013-01-19 ENCOUNTER — Ambulatory Visit: Payer: Self-pay | Admitting: Internal Medicine

## 2013-01-19 DIAGNOSIS — M79609 Pain in unspecified limb: Secondary | ICD-10-CM

## 2013-01-19 DIAGNOSIS — B20 Human immunodeficiency virus [HIV] disease: Secondary | ICD-10-CM

## 2013-01-19 MED ORDER — IBUPROFEN 600 MG PO TABS: 600.0000 mg | ORAL_TABLET | Freq: Three times a day (TID) | ORAL | Status: AC | PRN

## 2013-01-19 MED ORDER — ELVITEG-COBIC-EMTRICIT-TENOFDF 150-150-200-300 MG PO TABS: 1.0000 | ORAL_TABLET | Freq: Every day | ORAL | Status: DC

## 2013-01-25 ENCOUNTER — Ambulatory Visit: Payer: Self-pay | Admitting: Internal Medicine

## 2013-01-25 ENCOUNTER — Encounter: Payer: Self-pay | Admitting: Internal Medicine

## 2013-01-25 ENCOUNTER — Ambulatory Visit (INDEPENDENT_AMBULATORY_CARE_PROVIDER_SITE_OTHER): Payer: Self-pay | Admitting: Internal Medicine

## 2013-01-25 VITALS — BP 122/87 | HR 78 | Temp 98.0°F | Ht 66.0 in | Wt 315.0 lb

## 2013-01-25 DIAGNOSIS — Z23 Encounter for immunization: Secondary | ICD-10-CM

## 2013-01-25 DIAGNOSIS — B20 Human immunodeficiency virus [HIV] disease: Secondary | ICD-10-CM

## 2013-01-25 MED ORDER — ELVITEG-COBIC-EMTRICIT-TENOFDF 150-150-200-300 MG PO TABS: 1.0000 | ORAL_TABLET | Freq: Every day | ORAL | Status: DC

## 2013-01-25 NOTE — Progress Notes (Signed)
[facility] CLINIC NOTE  RFV: work-in for routine visit Subjective:    Patient ID: Stephanie Dixon, female    DOB: 01-Oct-1970, 42 y.o.   MRN: [id number]  HPI 42yo F with HIV, dx in 2012 in setting of pregnancy, recent CD 4 count of 470/VL< 20 on stribild. Her daughter Marcelino Duster is 74 months old now. She is no longer swinging between here and [place] since her mother has relocated down to [place] to be with her. Doing well with adherence. No missing doses. Started on metformin by pcp in [place].  Current Outpatient Prescriptions on File Prior to Visit  Medication Sig Dispense Refill  . elvitegravir-cobicistat-emtricitabine-tenofovir (STRIBILD) 150-150-200-300 MG TABS tablet Take 1 tablet by mouth daily with breakfast.  30 tablet  5  . ibuprofen (ADVIL,MOTRIN) 600 MG tablet Take 1 tablet (600 mg total) by mouth every 8 (eight) hours as needed for pain.  30 tablet  2   No current facility-administered medications on file prior to visit.  - metformin Active Ambulatory Problems    Diagnosis Date Noted  . HIV (human immunodeficiency virus infection) 04/17/2011  . Advanced maternal age risk, currently pregnant 04/17/2011  . Gestational diabetes 04/17/2011  . Hypertension 04/17/2011  . Obesity 04/17/2011  . Twin liveborn born in hospital by C-section 04/17/2011   Resolved Ambulatory Problems    Diagnosis Date Noted  . Vitamin d deficiency 04/17/2011   Past Medical History  Diagnosis Date  . Diabetes mellitus       Review of Systems Review of Systems  Constitutional: Negative for fever, chills, diaphoresis, activity change, appetite change, fatigue and unexpected weight change.  HENT: Negative for congestion, sore throat, rhinorrhea, sneezing, trouble swallowing and sinus pressure.  Eyes: Negative for photophobia and visual disturbance.  Respiratory: Negative for cough, chest tightness, shortness of breath, wheezing and stridor.  Cardiovascular: Negative for chest pain, palpitations and leg swelling.   Gastrointestinal: Negative for nausea, vomiting, abdominal pain, diarrhea, constipation, blood in stool, abdominal distention and anal bleeding.  Genitourinary: Negative for dysuria, hematuria, flank pain and difficulty urinating.  Musculoskeletal: right elbow pain, no trauma Skin: Negative for color change, pallor, rash and wound.  Neurological: Negative for dizziness, tremors, weakness and light-headedness.  Hematological: Negative for adenopathy. Does not bruise/bleed easily.  Psychiatric/Behavioral: Negative for behavioral problems, confusion, sleep disturbance, dysphoric mood, decreased concentration and agitation.       Objective:   Physical Exam BP 122/87  Pulse 78  Temp(Src) 98 F (36.7 C) (Oral)  Ht 5\' 6"  (1.676 m)  Wt 315 lb (142.883 kg)  BMI 50.87 kg/m2 Physical Exam  Constitutional:oriented to person, place, and time.  appears well-developed and well-nourished. No distress.  HENT:  Mouth/Throat: Oropharynx is clear and moist. No oropharyngeal exudate.  Cardiovascular: Normal rate, regular rhythm and normal heart sounds. Exam reveals no gallop and no friction rub.  No murmur heard.  Pulmonary/Chest: Effort normal and breath sounds normal. No respiratory distress.  no wheezes.  Lymphadenopathy: no cervical adenopathy.  BJY:NWGNF elbow point tenderness at tendon insertion Skin: Skin is warm and dry. No rash noted. No erythema.        Assessment & Plan:  hiv = will refill stribild  htn = repeat bp was within normal limits. Likely reading error vs. White coat syndrome  Bursitis of left elbow = will do a trial of naproxyn  Health maintenance = flu shot today

## 2013-02-02 ENCOUNTER — Other Ambulatory Visit: Payer: Self-pay | Admitting: Licensed Clinical Social Worker

## 2013-02-02 DIAGNOSIS — B20 Human immunodeficiency virus [HIV] disease: Secondary | ICD-10-CM

## 2013-02-02 MED ORDER — ELVITEG-COBIC-EMTRICIT-TENOFDF 150-150-200-300 MG PO TABS: 1.0000 | ORAL_TABLET | Freq: Every day | ORAL | Status: DC

## 2013-02-22 ENCOUNTER — Ambulatory Visit: Payer: Self-pay | Admitting: Internal Medicine

## 2013-02-22 ENCOUNTER — Telehealth: Payer: Self-pay | Admitting: *Deleted

## 2013-02-22 NOTE — Telephone Encounter (Signed)
Called patient about her missed appt and she advised she forgot and is out of town. She advised she will call and reschedule when she returns. Advised patient she has had several no shows and will now need to be a walk in. But for her to call to find out when Dr Drue Second is in the office once she gets back to town.

## 2013-03-22 ENCOUNTER — Telehealth: Payer: Self-pay | Admitting: *Deleted

## 2013-03-22 NOTE — Telephone Encounter (Signed)
Patient called concerned that she has been exposed to the flu from her Aunt who had been staying with her. The Aunt is now in the hospital and patient said she does not feel well. No MDs in clinic this week. Patient advised to go to Urgent Care to be tested for the flu. Wendall Mola

## 2013-03-22 NOTE — Telephone Encounter (Signed)
Annual PAP smear needed.   Message left requesting pt call for appt.

## 2013-05-10 ENCOUNTER — Other Ambulatory Visit (INDEPENDENT_AMBULATORY_CARE_PROVIDER_SITE_OTHER): Payer: Self-pay

## 2013-05-10 DIAGNOSIS — Z79899 Other long term (current) drug therapy: Secondary | ICD-10-CM

## 2013-05-10 DIAGNOSIS — Z113 Encounter for screening for infections with a predominantly sexual mode of transmission: Secondary | ICD-10-CM

## 2013-05-10 DIAGNOSIS — B20 Human immunodeficiency virus [HIV] disease: Secondary | ICD-10-CM

## 2013-05-10 LAB — COMPLETE METABOLIC PANEL WITH GFR
ALBUMIN: 4.1 g/dL (ref 3.5–5.2)
ALK PHOS: 61 U/L (ref 39–117)
ALT: 17 U/L (ref 0–35)
AST: 16 U/L (ref 0–37)
BUN: 10 mg/dL (ref 6–23)
CO2: 26 meq/L (ref 19–32)
Calcium: 9.1 mg/dL (ref 8.4–10.5)
Chloride: 106 mEq/L (ref 96–112)
Creat: 0.73 mg/dL (ref 0.50–1.10)
GLUCOSE: 94 mg/dL (ref 70–99)
POTASSIUM: 3.8 meq/L (ref 3.5–5.3)
Sodium: 141 mEq/L (ref 135–145)
Total Bilirubin: 0.5 mg/dL (ref 0.2–1.2)
Total Protein: 7 g/dL (ref 6.0–8.3)

## 2013-05-10 LAB — CBC WITH DIFFERENTIAL/PLATELET
BASOS ABS: 0 10*3/uL (ref 0.0–0.1)
BASOS PCT: 1 % (ref 0–1)
Eosinophils Absolute: 0.2 10*3/uL (ref 0.0–0.7)
Eosinophils Relative: 3 % (ref 0–5)
HCT: 36.4 % (ref 36.0–46.0)
Hemoglobin: 12.9 g/dL (ref 12.0–15.0)
LYMPHS PCT: 35 % (ref 12–46)
Lymphs Abs: 1.9 10*3/uL (ref 0.7–4.0)
MCH: 32.7 pg (ref 26.0–34.0)
MCHC: 35.4 g/dL (ref 30.0–36.0)
MCV: 92.2 fL (ref 78.0–100.0)
Monocytes Absolute: 0.4 10*3/uL (ref 0.1–1.0)
Monocytes Relative: 8 % (ref 3–12)
NEUTROS ABS: 2.9 10*3/uL (ref 1.7–7.7)
NEUTROS PCT: 53 % (ref 43–77)
Platelets: 248 10*3/uL (ref 150–400)
RBC: 3.95 MIL/uL (ref 3.87–5.11)
RDW: 13.1 % (ref 11.5–15.5)
WBC: 5.5 10*3/uL (ref 4.0–10.5)

## 2013-05-10 LAB — LIPID PANEL
CHOLESTEROL: 101 mg/dL (ref 0–200)
HDL: 32 mg/dL — AB (ref >39)
LDL Cholesterol: 42 mg/dL (ref 0–99)
TRIGLYCERIDES: 137 mg/dL (ref <150)
Total CHOL/HDL Ratio: 3.2 Ratio
VLDL: 27 mg/dL (ref 0–40)

## 2013-05-10 LAB — RPR

## 2013-05-11 LAB — T-HELPER CELL (CD4) - (RCID CLINIC ONLY)
CD4 T CELL HELPER: 28 % — AB (ref 33–55)
CD4 T Cell Abs: 560 /uL (ref 400–2700)

## 2013-05-11 LAB — HIV-1 RNA QUANT-NO REFLEX-BLD
HIV 1 RNA Quant: <20 copies/mL (ref <20)
HIV-1 RNA Quant, Log: <1.3 {Log} (ref <1.30)

## 2013-05-26 ENCOUNTER — Encounter: Payer: Self-pay | Admitting: Internal Medicine

## 2013-05-26 ENCOUNTER — Ambulatory Visit (INDEPENDENT_AMBULATORY_CARE_PROVIDER_SITE_OTHER): Payer: Self-pay | Admitting: Internal Medicine

## 2013-05-26 ENCOUNTER — Ambulatory Visit: Payer: Self-pay

## 2013-05-26 VITALS — BP 143/91 | HR 76 | Temp 98.2°F | Wt 320.0 lb

## 2013-05-26 DIAGNOSIS — E669 Obesity, unspecified: Secondary | ICD-10-CM

## 2013-05-26 LAB — HEMOGLOBIN A1C
Hgb A1c MFr Bld: 6.8 % — ABNORMAL HIGH (ref <5.7)
MEAN PLASMA GLUCOSE: 148 mg/dL — AB (ref <117)

## 2013-05-26 MED ORDER — METFORMIN HCL 500 MG PO TABS: 500.0000 mg | ORAL_TABLET | Freq: Two times a day (BID) | ORAL | Status: DC

## 2013-05-26 NOTE — Progress Notes (Signed)
   Subjective:    Patient ID: Zackery BarefootCharlotte Harpham, female    DOB: 09/29/1970, 43 y.o.   MRN: [id number]  HPI 43yo F with HIV, CD 4 count of 560/VL<20, on stribild, doing well. Weight gain is main concern. She just came back from [place] visiting her daughters  Current Outpatient Prescriptions on File Prior to Visit  Medication Sig Dispense Refill  . elvitegravir-cobicistat-emtricitabine-tenofovir (STRIBILD) 150-150-200-300 MG TABS tablet Take 1 tablet by mouth daily with breakfast.  30 tablet  11  . ibuprofen (ADVIL,MOTRIN) 600 MG tablet Take 1 tablet (600 mg total) by mouth every 8 (eight) hours as needed for pain.  30 tablet  2   No current facility-administered medications on file prior to visit.   Active Ambulatory Problems    Diagnosis Date Noted  . HIV (human immunodeficiency virus infection) 04/17/2011  . Advanced maternal age risk, currently pregnant 04/17/2011  . Gestational diabetes 04/17/2011  . Hypertension 04/17/2011  . Obesity 04/17/2011  . Twin liveborn born in hospital by C-section 04/17/2011   Resolved Ambulatory Problems    Diagnosis Date Noted  . Vitamin d deficiency 04/17/2011   Past Medical History  Diagnosis Date  . Diabetes mellitus        Review of Systems 10 point ROS is negative except for weight gain    Objective:   Physical Exam BP 143/91  Pulse 76  Temp(Src) 98.2 F (36.8 C) (Oral)  Wt 320 lb (145.151 kg)  Breastfeeding? No Physical Exam  Constitutional:  oriented to person, place, and time. appears well-developed and well-nourished. No distress.  HENT:  Mouth/Throat: Oropharynx is clear and moist. No oropharyngeal exudate.  Cardiovascular: Normal rate, regular rhythm and normal heart sounds. Exam reveals no gallop and no friction rub.  No murmur heard.  Pulmonary/Chest: Effort normal and breath sounds normal. No respiratory distress.  has no wheezes.  Abdominal: Soft. Bowel sounds are normal.  exhibits no distension. There is no tenderness.    Lymphadenopathy: no cervical adenopathy.  Neurological: alert and oriented to person, place, and time.  Skin: Skin is warm and dry. No rash noted. No erythema.  Psychiatric: a normal mood and affect. His behavior is normal.         Assessment & Plan:  hiv = well controlled, will continue with stribild  Weight gain = will sign up with nutritionist plus probably has element of insulin resistance, will start low dose metformin. Check hba1c  rtc in 4 wk

## 2013-05-27 ENCOUNTER — Ambulatory Visit: Payer: Self-pay

## 2013-06-02 ENCOUNTER — Telehealth: Payer: Self-pay | Admitting: *Deleted

## 2013-06-02 NOTE — Telephone Encounter (Signed)
Patient called for results of HbgA1C, results given. Stephanie MolaJacqueline Lamondre Wesche

## 2013-06-03 ENCOUNTER — Ambulatory Visit: Payer: Self-pay

## 2013-06-06 ENCOUNTER — Ambulatory Visit: Payer: Self-pay | Admitting: Internal Medicine

## 2013-06-28 ENCOUNTER — Ambulatory Visit: Payer: Self-pay | Admitting: Internal Medicine

## 2013-07-05 ENCOUNTER — Other Ambulatory Visit: Payer: Self-pay | Admitting: *Deleted

## 2013-07-05 ENCOUNTER — Encounter: Payer: Self-pay | Admitting: *Deleted

## 2013-07-05 DIAGNOSIS — B20 Human immunodeficiency virus [HIV] disease: Secondary | ICD-10-CM

## 2013-07-05 MED ORDER — ELVITEG-COBIC-EMTRICIT-TENOFDF 150-150-200-300 MG PO TABS: 1.0000 | ORAL_TABLET | Freq: Every day | ORAL | Status: DC

## 2013-07-20 NOTE — Progress Notes (Unsigned)
Patient ID: Stephanie BarefootCharlotte Dixon, female   DOB: 03-08-1971, 43 y.o.   MRN: [id number] [facility] CM services for financial assistance. File expires 10/05/13.

## 2013-07-21 ENCOUNTER — Ambulatory Visit (INDEPENDENT_AMBULATORY_CARE_PROVIDER_SITE_OTHER): Payer: Self-pay | Admitting: Internal Medicine

## 2013-07-21 ENCOUNTER — Encounter: Payer: Self-pay | Admitting: Internal Medicine

## 2013-07-21 VITALS — BP 110/73 | HR 120 | Temp 98.1°F | Wt 311.0 lb

## 2013-07-21 DIAGNOSIS — R7303 Prediabetes: Secondary | ICD-10-CM

## 2013-07-21 DIAGNOSIS — B20 Human immunodeficiency virus [HIV] disease: Secondary | ICD-10-CM

## 2013-07-21 DIAGNOSIS — R7309 Other abnormal glucose: Secondary | ICD-10-CM

## 2013-07-21 IMAGING — US US OB DETAIL+14 WK
3 series · 12 of 28 positions shown · non-contrast
Comparison: none

[Series 1: us ob detail +14 wk · 14 acquisitions, 2 frames shown (1 of 3)]
[im 5/14]
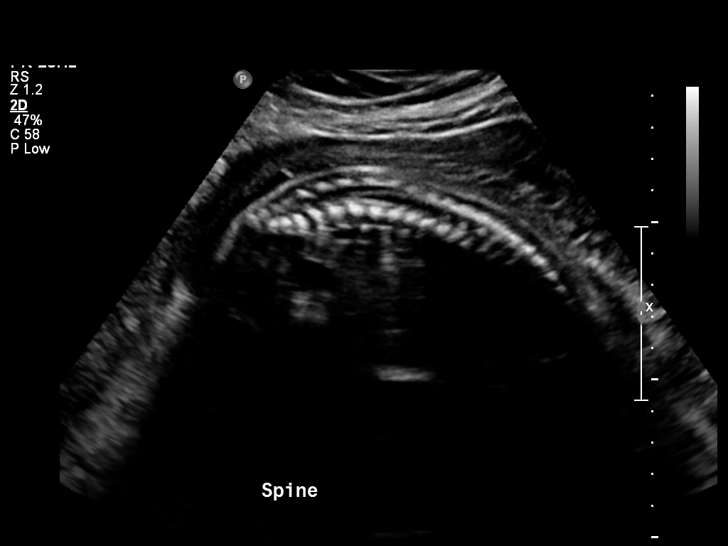
[im 14/14]
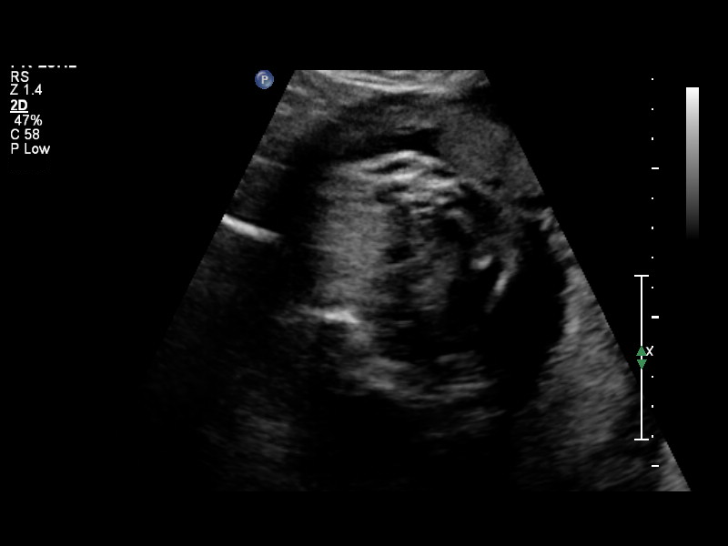

[Series 1: us ob detail +14 wk · 1 of 9 slices shown (2 of 3)]
[im 5/9]
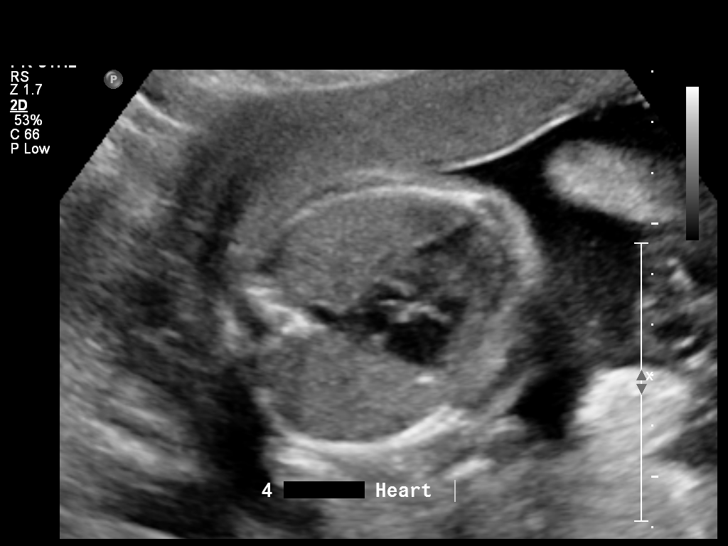

[Series 1: us ob detail +14 wk · 9 of 66 slices shown (3 of 3)]
[im 4/66]
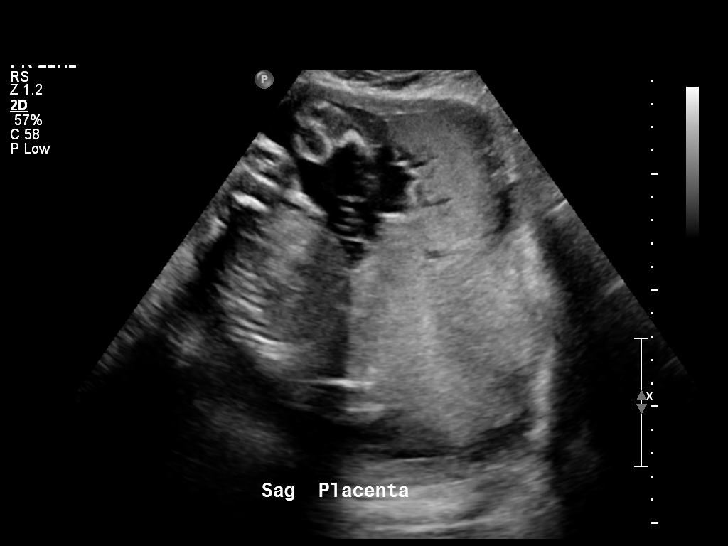
[im 10/66]
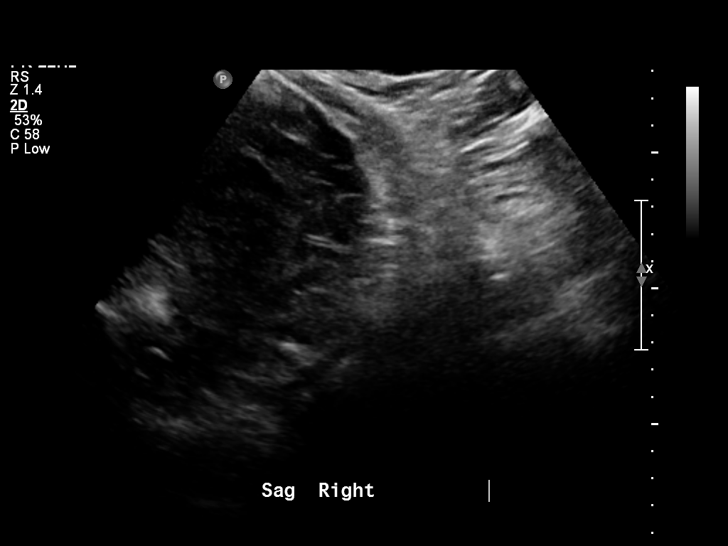
[im 17/66]
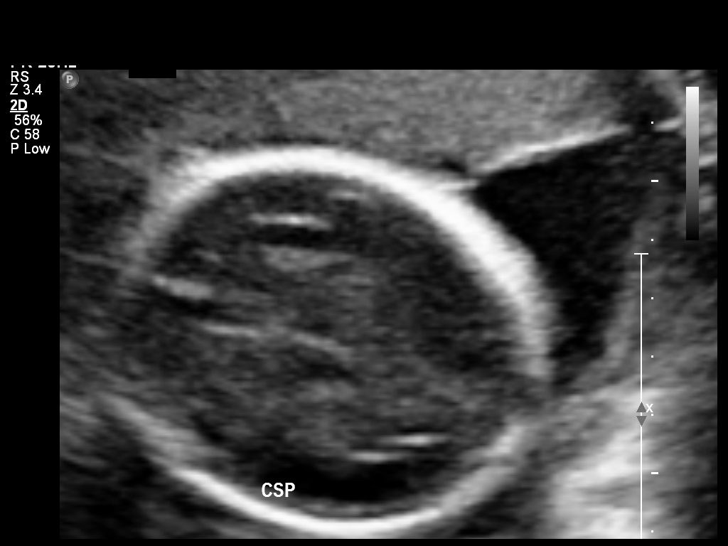
[im 27/66]
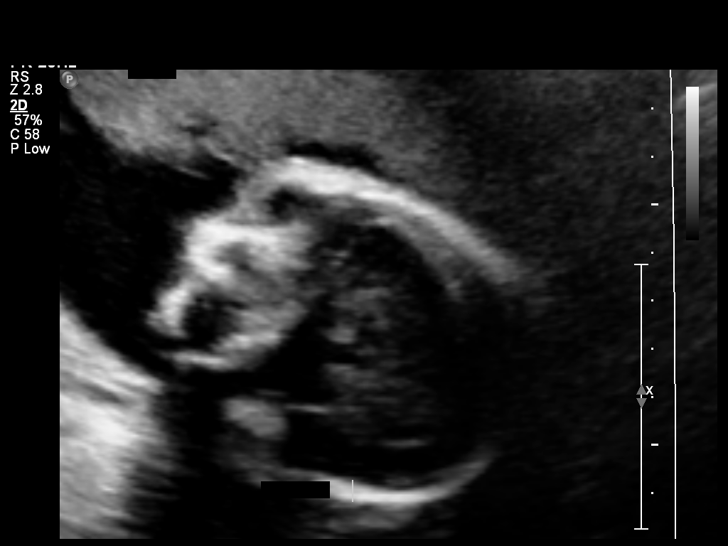
[im 33/66]
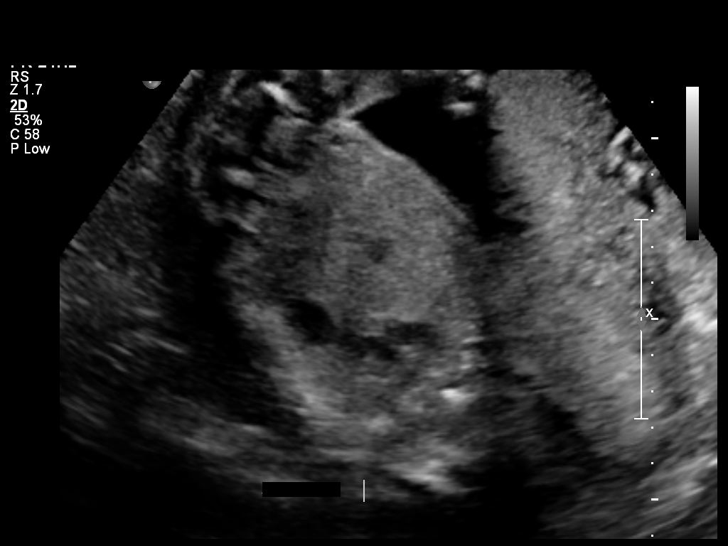
[im 40/66]
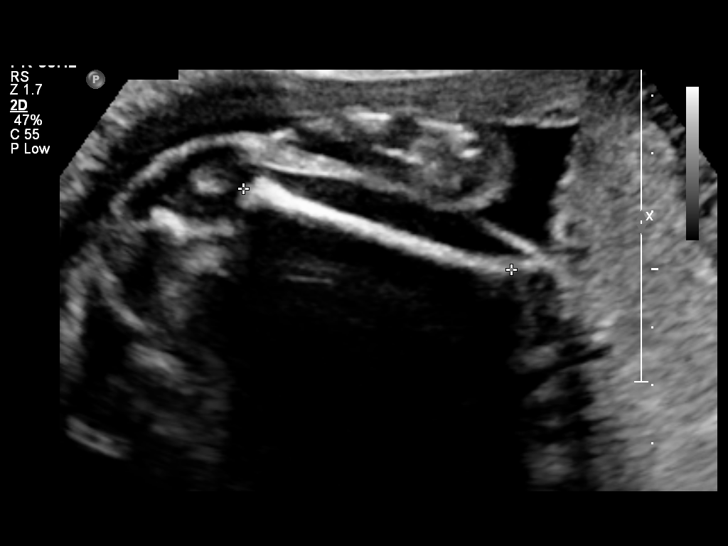
[im 49/66]
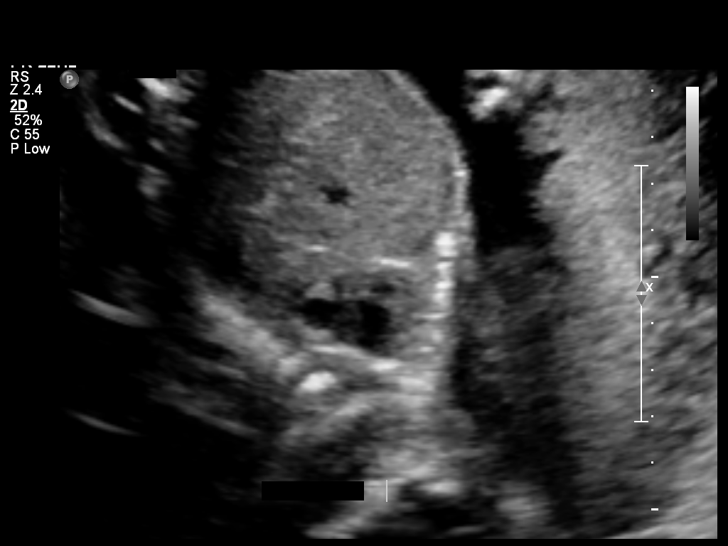
[im 56/66]
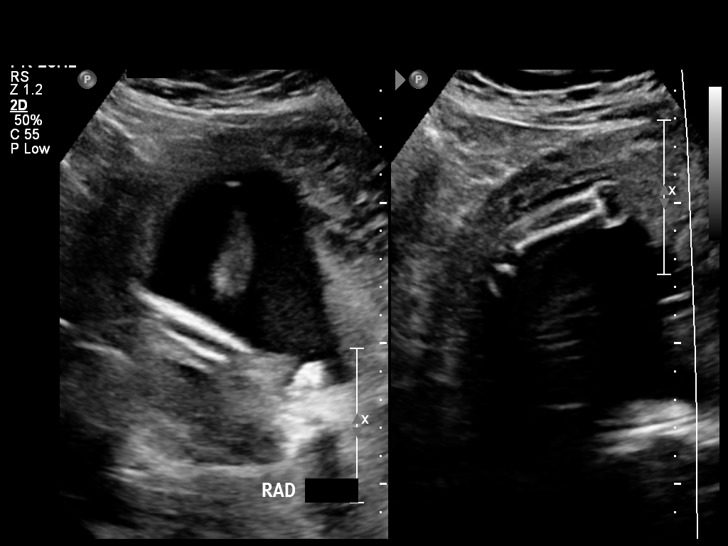
[im 62/66]
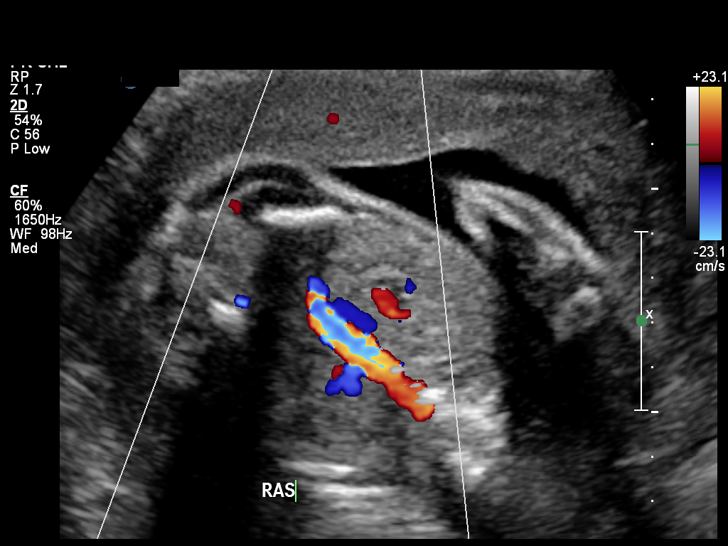

[12 of 28 positions shown; findings below may reference images not displayed]

OBSTETRICS REPORT
                      (Signed Final 06/26/2011 [DATE])

 Order#:         [id number]
Procedures

 US OB DETAIL + 14 WK                                  76811.0
Indications

 Detailed fetal anatomic survey
 HIV: Asymptomatic                                     647.63 V08
 Diabetes - Pregestational
 Hypertension - Chronic/Pre-existing
 Previous cesarean section
 Obesity (303 LBS)
Fetal Evaluation

 Fetal Heart Rate:  146                         bpm
 Cardiac Activity:  Observed
 Presentation:      Cephalic
 Placenta:          Left lateral, above cervical
                    os
 P. Cord            Visualized
 Insertion:

 Amniotic Fluid
 AFI FV:      Subjectively within normal limits
                                             Larg Pckt:     9.0  cm
Biometry

 BPD:     61.1  mm    G. Age:   24w 6d                CI:        74.09   70 - 86
                                                      FL/HC:      21.2   18.7 -

 HC:     225.4  mm    G. Age:   24w 4d       42  %    HC/AC:      1.06   1.05 -

 AC:     212.9  mm    G. Age:   25w 6d       84  %    FL/BPD:     78.2   71 - 87
 FL:      47.8  mm    G. Age:   26w 0d       85  %    FL/AC:      22.5   20 - 24
 HUM:     42.2  mm    G. Age:   25w 3d       68  %
 Est. FW:     841  gm    1 lb 14 oz      76  %
Gestational Age

 LMP:           24w 2d       Date:   01/07/11                 EDD:   10/14/11
 U/S Today:     25w 2d                                        EDD:   10/07/11
 Best:          24w 2d    Det. By:   LMP  (01/07/11)          EDD:   10/14/11
Anatomy

 Cranium:           Appears normal      Aortic Arch:       Not well
                                                           visualized
 Fetal Cavum:       Appears normal      Ductal Arch:       Appears normal
 Ventricles:        Appears normal      Diaphragm:         Appears normal
 Choroid Plexus:    Appears normal      Stomach:           Appears
                                                           normal, left
                                                           sided
 Cerebellum:        Appears normal      Abdomen:           Appears normal
 Posterior Fossa:   Appears normal      Abdominal Wall:    Appears nml
                                                           (cord insert,
                                                           abd wall)
 Nuchal Fold:       Not applicable      Cord Vessels:      Appears normal
                    (>20 wks GA)                           (3 vessel cord)
 Face:              Appears normal      Kidneys:           Appear normal
                    (lips/profile/orbit
                    s)
 Heart:             Appears normal      Bladder:           Appears normal
                    (4 chamber &
                    axis)
 RVOT:              Appears normal      Spine:             Appears normal
 LVOT:              Appears normal      Limbs:             Appears normal
                                                           (hands, ankles,
                                                           feet)

 Other:     Fetus appears to be a female. Heels visualized. RT 5th
            digit visualized. Technically difficult due to maternal
            habitus and fetal position.
Cervix Uterus Adnexa

 Cervical Length:   3.85      cm

 Cervix:       Normal appearance by transabdominal scan.

 Adnexa:     No abnormality visualized.
Impression

 Single intrauterine gestation demonstrating an estimated
 gestational age by ultrasound of 25w 2d  . This correlates
 well with expected EGA by LMP of 24w 2d  .

 No focal fetal or placental abnormalities are noted with a
 good anatomic evaluation possible. No soft markers for Down
 Syndrome are seen. Sonographic modification of Down
 Syndrome risk was not performed as the patient is beyond
 the EGA at which this assessment is typically performed.

 Subjectively and quantitatively normal amniotic fluid volume.
 Normal cervical length and appearance.

## 2013-07-21 NOTE — Progress Notes (Signed)
   Subjective:    Patient ID: Stephanie BarefootCharlotte Dixon, female    DOB: February 15, 1971, 43 y.o.   MRN: [id number]  HPI 43yo F with HIV, CD 4 count of 560/VL<20 on stribild with excellent adherence. Had significant weight gain since birth of her daughter in last 18 months. At last visit. Glu 148, HB A1c 6.8. Probably has element of insulin resistance. She was started on metformin. Lost #9 since last visit, she attributes to stress. Doing well from health standpoint, has many financial stressors. ssi office mistakenly revoked her monthly check.  Current Outpatient Prescriptions on File Prior to Visit  Medication Sig Dispense Refill  . elvitegravir-cobicistat-emtricitabine-tenofovir (STRIBILD) 150-150-200-300 MG TABS tablet Take 1 tablet by mouth daily with breakfast.  30 tablet  11  . ibuprofen (ADVIL,MOTRIN) 600 MG tablet Take 1 tablet (600 mg total) by mouth every 8 (eight) hours as needed for pain.  30 tablet  2  . metFORMIN (GLUCOPHAGE) 500 MG tablet Take 1 tablet (500 mg total) by mouth 2 (two) times daily with a meal.  60 tablet  1   No current facility-administered medications on file prior to visit.   Active Ambulatory Problems    Diagnosis Date Noted  . HIV (human immunodeficiency virus infection) 04/17/2011  . Advanced maternal age risk, currently pregnant 04/17/2011  . Gestational diabetes 04/17/2011  . Hypertension 04/17/2011  . Obesity 04/17/2011  . Twin liveborn born in hospital by C-section 04/17/2011   Resolved Ambulatory Problems    Diagnosis Date Noted  . Vitamin d deficiency 04/17/2011   Past Medical History  Diagnosis Date  . Diabetes mellitus       Review of Systems 10 point ros is negative    Objective:   Physical Exam BP 110/73  Pulse 120  Temp(Src) 98.1 F (36.7 C) (Oral)  Wt 311 lb (141.069 kg)  LMP 07/11/2013  Breastfeeding? No Physical Exam  Constitutional:  oriented to person, place, and time. appears well-developed and well-nourished. No distress.    HENT:  Mouth/Throat: Oropharynx is clear and moist. No oropharyngeal exudate.  Cardiovascular: Normal rate, regular rhythm and normal heart sounds. Exam reveals no gallop and no friction rub.  No murmur heard.  Pulmonary/Chest: Effort normal and breath sounds normal. No respiratory distress.  has no wheezes.  Abdominal: Soft. Bowel sounds are normal.  exhibits no distension. There is no tenderness.  Lymphadenopathy: no cervical adenopathy.  Neurological: alert and oriented to person, place, and time.  Skin: Skin is warm and dry. No rash noted. No erythema.  Psychiatric: a normal mood and affect.  behavior is normal.       Assessment & Plan:  hiv = well controlled, continue on stribild  Insulin resistance/obesity = continue with metformin, but will likely need diet modification  Other = being evaluated for reprieve study to start statin  rtc in 2 months

## 2013-07-22 ENCOUNTER — Ambulatory Visit (INDEPENDENT_AMBULATORY_CARE_PROVIDER_SITE_OTHER): Payer: Medicaid Other | Admitting: *Deleted

## 2013-07-22 VITALS — BP 122/84 | HR 79 | Temp 98.0°F | Resp 16 | Ht 65.0 in | Wt 312.8 lb

## 2013-07-22 DIAGNOSIS — Z21 Asymptomatic human immunodeficiency virus [HIV] infection status: Secondary | ICD-10-CM

## 2013-07-22 DIAGNOSIS — B20 Human immunodeficiency virus [HIV] disease: Secondary | ICD-10-CM

## 2013-07-22 NOTE — Progress Notes (Signed)
Patient here for screening for the Reprieve study. Informed consent was obtained per SOP guidelines. We discussed the consent and she verbalized understanding of it. I gave her a copy yesterday, so she had time to read it last night and discussed it with her mother. She denies any current complaints or problems. She is morbidly obese and diabetic, but does not seem to have any other health problems. She was diagnosed when she was pregnant 2 years ago at which time she developed gestational diabetes and some mild hypertension. We plan to enroll her on May 13th.

## 2013-07-22 NOTE — Progress Notes (Signed)
   Subjective:    Patient ID: Stephanie Dixon, female    DOB: March 31, 1970, 43 y.o.   MRN: [id number]  HPI    Review of Systems  Constitutional: Negative.   HENT: Negative.   Respiratory: Negative.   Cardiovascular: Negative.   Gastrointestinal: Negative.   Endocrine: Negative.   Genitourinary: Negative.   Musculoskeletal: Negative.   Neurological: Negative.   Hematological: Negative.   Psychiatric/Behavioral: Negative.        Objective:   Physical Exam  Constitutional: She is oriented to person, place, and time. She appears well-developed and well-nourished.  HENT:  Mouth/Throat: Oropharynx is clear and moist. No oropharyngeal exudate.  Eyes: Right eye exhibits no discharge. Left eye exhibits no discharge. No scleral icterus.  Neck: Normal range of motion.  Cardiovascular: Normal rate, regular rhythm, normal heart sounds and intact distal pulses.   Pulmonary/Chest: Effort normal and breath sounds normal.  Abdominal: Soft. Bowel sounds are normal.  Musculoskeletal: Normal range of motion. She exhibits edema.  Lymphadenopathy:    She has no cervical adenopathy.  Neurological: She is alert and oriented to person, place, and time.  Skin: Skin is warm and dry.  Psychiatric: She has a normal mood and affect.          Assessment & Plan:

## 2013-07-23 LAB — PREGNANCY, URINE: Preg Test, Ur: NEGATIVE

## 2013-07-26 ENCOUNTER — Ambulatory Visit: Payer: Self-pay

## 2013-08-03 ENCOUNTER — Ambulatory Visit (INDEPENDENT_AMBULATORY_CARE_PROVIDER_SITE_OTHER): Payer: Medicaid Other | Admitting: *Deleted

## 2013-08-03 VITALS — BP 111/80 | HR 80 | Temp 98.4°F | Resp 16 | Wt 315.5 lb

## 2013-08-03 DIAGNOSIS — Z21 Asymptomatic human immunodeficiency virus [HIV] infection status: Secondary | ICD-10-CM

## 2013-08-03 DIAGNOSIS — B20 Human immunodeficiency virus [HIV] disease: Secondary | ICD-10-CM

## 2013-08-03 LAB — POCT URINE PREGNANCY: Preg Test, Ur: NEGATIVE

## 2013-08-03 NOTE — Progress Notes (Signed)
Patient was enrolled on the Reprieve Study today. A urine pregnancy test (negative) was obtained prior to submitting the enrollment form. She denies any new problems, medications or concerns since the screening visit. We discussed the dosing, side effects, adherence, when to call for problems regarding the pitavastatin. She was reminded to call for any side effects, especially muscle weakness, soreness. After hours phone contact was given in case there is a problem. She was also instructed to bring the pill bottle back at the 1 month visit so we can do a pill count. She was given a copy of the approved lifestyle information guide. An EKG was obtained for study.

## 2013-09-02 ENCOUNTER — Encounter: Payer: Self-pay | Admitting: Internal Medicine

## 2013-09-05 ENCOUNTER — Other Ambulatory Visit: Payer: Self-pay | Admitting: Internal Medicine

## 2013-09-22 ENCOUNTER — Ambulatory Visit (INDEPENDENT_AMBULATORY_CARE_PROVIDER_SITE_OTHER): Payer: Medicaid Other | Admitting: *Deleted

## 2013-09-22 ENCOUNTER — Ambulatory Visit (INDEPENDENT_AMBULATORY_CARE_PROVIDER_SITE_OTHER): Payer: Medicaid Other | Admitting: Internal Medicine

## 2013-09-22 ENCOUNTER — Encounter: Payer: Self-pay | Admitting: Internal Medicine

## 2013-09-22 VITALS — BP 152/91 | HR 73 | Temp 97.3°F | Wt 310.0 lb

## 2013-09-22 DIAGNOSIS — I10 Essential (primary) hypertension: Secondary | ICD-10-CM

## 2013-09-22 DIAGNOSIS — B20 Human immunodeficiency virus [HIV] disease: Secondary | ICD-10-CM

## 2013-09-22 DIAGNOSIS — Z113 Encounter for screening for infections with a predominantly sexual mode of transmission: Secondary | ICD-10-CM

## 2013-09-22 DIAGNOSIS — R7303 Prediabetes: Secondary | ICD-10-CM

## 2013-09-22 DIAGNOSIS — Z006 Encounter for examination for normal comparison and control in clinical research program: Secondary | ICD-10-CM

## 2013-09-22 DIAGNOSIS — R7309 Other abnormal glucose: Secondary | ICD-10-CM

## 2013-09-22 LAB — COMPREHENSIVE METABOLIC PANEL
ALK PHOS: 62 U/L (ref 39–117)
ALT: 19 U/L (ref 0–35)
AST: 17 U/L (ref 0–37)
Albumin: 4 g/dL (ref 3.5–5.2)
BUN: 11 mg/dL (ref 6–23)
CHLORIDE: 104 meq/L (ref 96–112)
CO2: 25 mEq/L (ref 19–32)
Calcium: 9.2 mg/dL (ref 8.4–10.5)
Creat: 0.72 mg/dL (ref 0.50–1.10)
GLUCOSE: 114 mg/dL — AB (ref 70–99)
Potassium: 4.1 mEq/L (ref 3.5–5.3)
Sodium: 138 mEq/L (ref 135–145)
Total Bilirubin: 0.6 mg/dL (ref 0.2–1.2)
Total Protein: 6.8 g/dL (ref 6.0–8.3)

## 2013-09-22 LAB — CBC WITH DIFFERENTIAL/PLATELET
Basophils Absolute: 0 10*3/uL (ref 0.0–0.1)
Basophils Relative: 0 % (ref 0–1)
Eosinophils Absolute: 0.1 10*3/uL (ref 0.0–0.7)
Eosinophils Relative: 2 % (ref 0–5)
HCT: 37.1 % (ref 36.0–46.0)
Hemoglobin: 12.6 g/dL (ref 12.0–15.0)
LYMPHS ABS: 1.7 10*3/uL (ref 0.7–4.0)
LYMPHS PCT: 36 % (ref 12–46)
MCH: 31.7 pg (ref 26.0–34.0)
MCHC: 34 g/dL (ref 30.0–36.0)
MCV: 93.5 fL (ref 78.0–100.0)
Monocytes Absolute: 0.3 10*3/uL (ref 0.1–1.0)
Monocytes Relative: 7 % (ref 3–12)
NEUTROS ABS: 2.6 10*3/uL (ref 1.7–7.7)
Neutrophils Relative %: 55 % (ref 43–77)
PLATELETS: 220 10*3/uL (ref 150–400)
RBC: 3.97 MIL/uL (ref 3.87–5.11)
RDW: 15 % (ref 11.5–15.5)
WBC: 4.8 10*3/uL (ref 4.0–10.5)

## 2013-09-22 LAB — HCG, SERUM, QUALITATIVE: PREG SERUM: NEGATIVE

## 2013-09-22 LAB — HEMOGLOBIN A1C
Hgb A1c MFr Bld: 6.5 % — ABNORMAL HIGH (ref <5.7)
Mean Plasma Glucose: 140 mg/dL — ABNORMAL HIGH (ref <117)

## 2013-09-22 MED ORDER — ELVITEG-COBIC-EMTRICIT-TENOFDF 150-150-200-300 MG PO TABS: ORAL_TABLET | ORAL | Status: DC

## 2013-09-22 MED ORDER — METFORMIN HCL 500 MG PO TABS: ORAL_TABLET | ORAL | Status: DC

## 2013-09-22 MED ORDER — ELVITEG-COBIC-EMTRICIT-TENOFDF 150-150-200-300 MG PO TABS: 1.0000 | ORAL_TABLET | Freq: Every day | ORAL | Status: DC

## 2013-09-22 MED ORDER — HYDROCHLOROTHIAZIDE 25 MG PO TABS: 25.0000 mg | ORAL_TABLET | Freq: Every day | ORAL | Status: DC

## 2013-09-22 NOTE — Progress Notes (Signed)
   Subjective:    Patient ID: Zackery BarefootCharlotte Lamay, female    DOB: 03-Oct-1970, 43 y.o.   MRN: [id number]  HPI 43yo F with HIV disease, CD 4 count of 560/VL<20, on stribild. Her daughter Marcelino Dustermichelle is about to be 42 yr old. Doing well. Happy since no longer having financial instability. She has medicaid. Able to get a house through [place] in [place]. Food stamp issue now resolved all came together in month in May. Mother and sister live in [place]. Now rekindling her relationship with her son. Still has 2 daughters that live in [place]. "i am just happy"  Current Outpatient Prescriptions on File Prior to Visit  Medication Sig Dispense Refill  . elvitegravir-cobicistat-emtricitabine-tenofovir (STRIBILD) 150-150-200-300 MG TABS tablet Take 1 tablet by mouth daily with breakfast.  30 tablet  11  . ibuprofen (ADVIL,MOTRIN) 600 MG tablet Take 1 tablet (600 mg total) by mouth every 8 (eight) hours as needed for pain.  30 tablet  2  . metFORMIN (GLUCOPHAGE) 500 MG tablet TAKE 1 TABLET BY MOUTH TWICE DAILY WITH MEALS  60 tablet  0   No current facility-administered medications on file prior to visit.   Active Ambulatory Problems    Diagnosis Date Noted  . HIV (human immunodeficiency virus infection) 04/17/2011  . Advanced maternal age risk, currently pregnant 04/17/2011  . Gestational diabetes 04/17/2011  . Hypertension 04/17/2011  . Obesity 04/17/2011  . Twin liveborn born in hospital by C-section 04/17/2011   Resolved Ambulatory Problems    Diagnosis Date Noted  . Vitamin d deficiency 04/17/2011   Past Medical History  Diagnosis Date  . Diabetes mellitus     Review of Systems 10 point ros is negative    Objective:   Physical Exam BP 152/91  Pulse 73  Temp(Src) 97.3 F (36.3 C) (Oral)  Wt 310 lb (140.615 kg) Physical Exam  Constitutional:  oriented to person, place, and time. appears well-developed and well-nourished. No distress.  HENT:  Mouth/Throat: Oropharynx is clear and  moist. No oropharyngeal exudate.  Cardiovascular: Normal rate, regular rhythm and normal heart sounds. Exam reveals no gallop and no friction rub.  No murmur heard.  Pulmonary/Chest: Effort normal and breath sounds normal. No respiratory distress.  has no wheezes.  Abdominal: Soft. Bowel sounds are normal.  exhibits no distension. There is no tenderness.  Lymphadenopathy: no cervical adenopathy.  Neurological: alert and oriented to person, place, and time.  Skin: Skin is warm and dry. No rash noted. No erythema.  Psychiatric: a normal mood and affect.  behavior is normal.       Assessment & Plan:  hiv = well controlled will check labs today; refill stribid  HTN = will start on HCTZ 25mg  daily  Insulin resistance/early DM = refill metformin  obesity = still working on diet modification.  Health maintenance = will check lipids  rtc in 4 wk

## 2013-09-22 NOTE — Progress Notes (Signed)
Seen today for the Reprieve study after she saw Dr. Drue SecondSnider. She forgot to bring her pill bottle back with her. She says she has moved to [place], but will continue to come here for her care and study. I scheduled her next study appt on the same day as Dr. Feliz BeamSnider's. She has not noticed any side effects from the pitavastatin nor had any new problems. Dr. Drue SecondSnider did giver her a prescription for HCTZ for elevated BP today.

## 2013-09-23 LAB — HIV-1 RNA QUANT-NO REFLEX-BLD
HIV 1 RNA Quant: <20 copies/mL (ref <20)
HIV-1 RNA Quant, Log: <1.3 {Log} (ref <1.30)

## 2013-09-26 LAB — T-HELPER CELL (CD4) - (RCID CLINIC ONLY)
CD4 T CELL HELPER: 26 % — AB (ref 33–55)
CD4 T Cell Abs: 430 /uL (ref 400–2700)

## 2013-10-03 IMAGING — US US ABDOMEN COMPLETE
1 series · 13 of 25 positions shown · non-contrast
Comparison: None.

CLINICAL DATA: Epigastric abdominal pain; 34 weeks pregnant

COMPLETE ABDOMINAL ULTRASOUND

[Series 1: us abdomen complete · 13 of 78 slices shown]
[im 1/78]
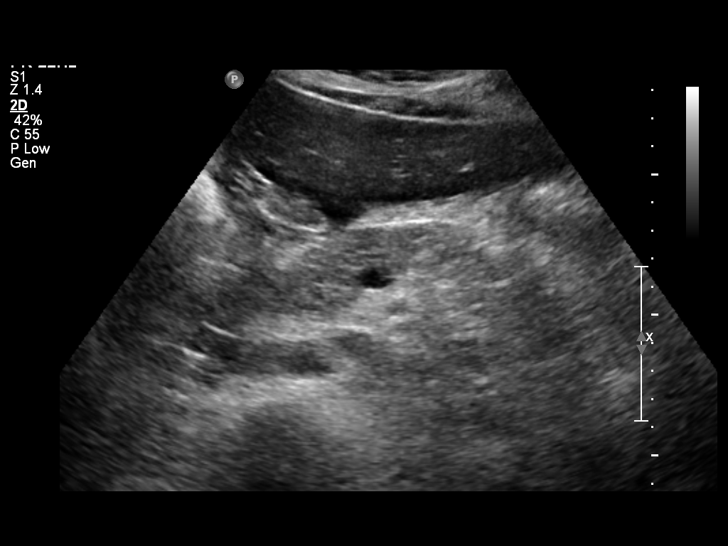
[im 7/78]
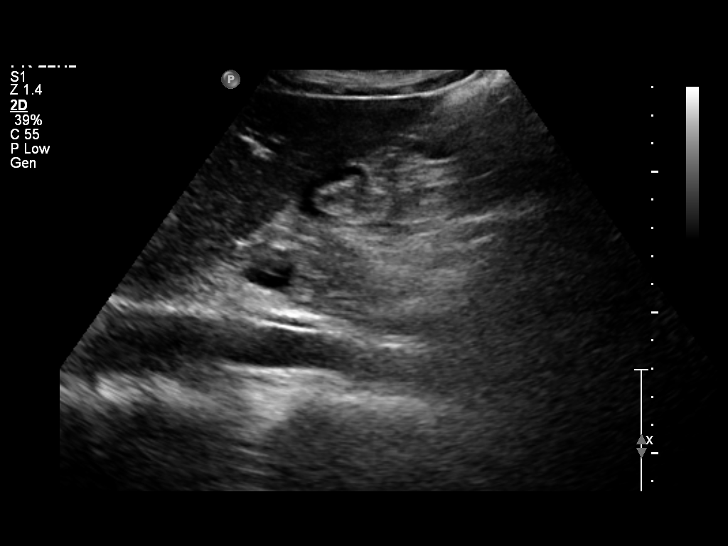
[im 13/78]
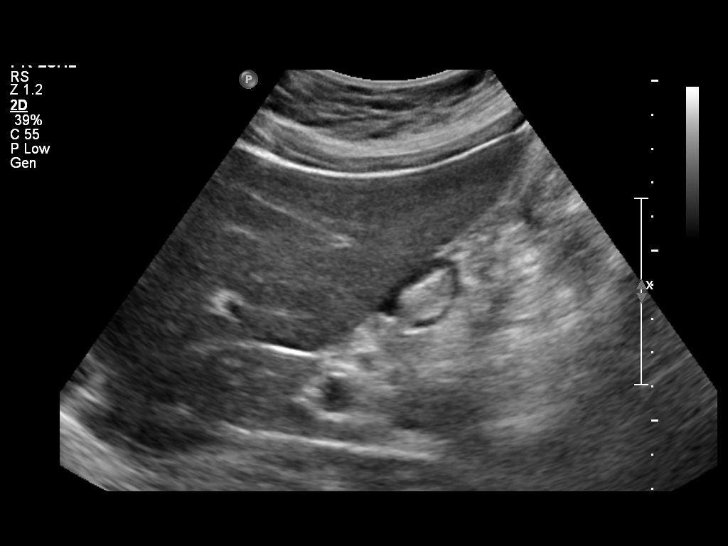
[im 20/78]
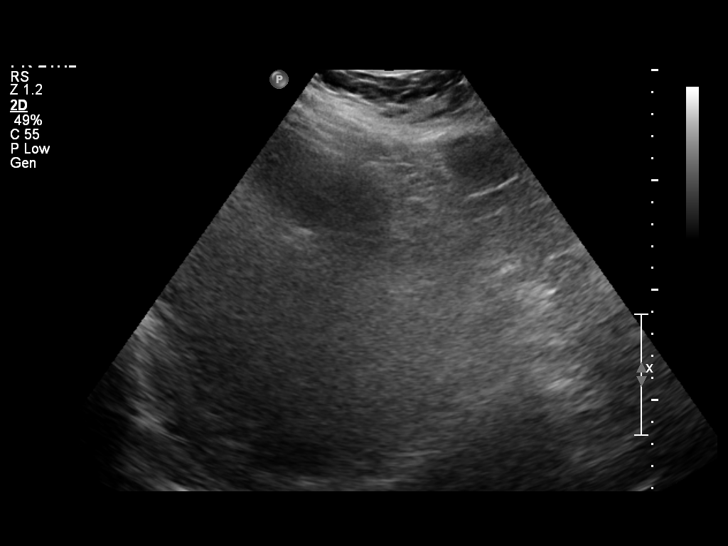
[im 26/78]
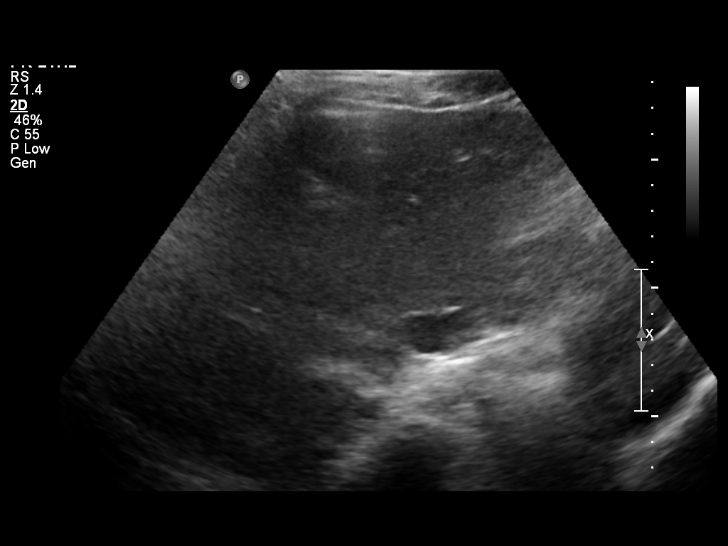
[im 33/78]
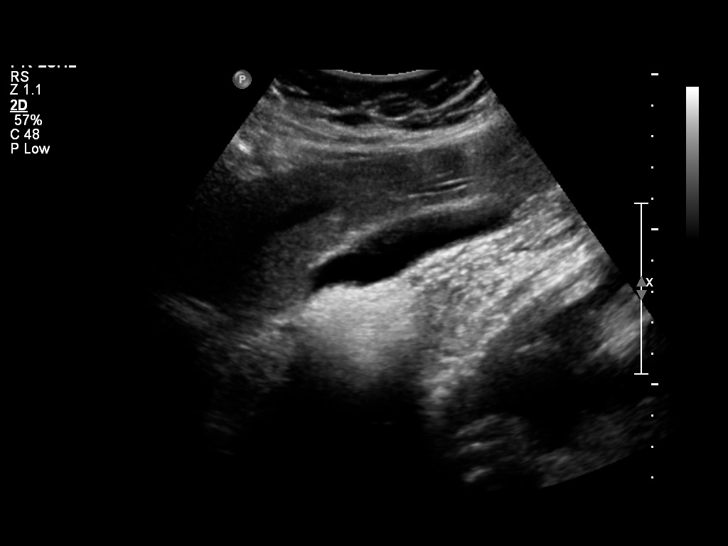
[im 39/78]
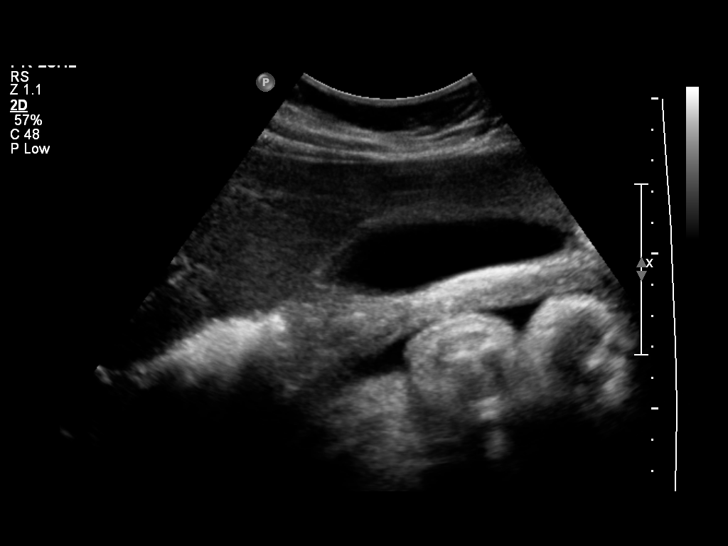
[im 45/78]
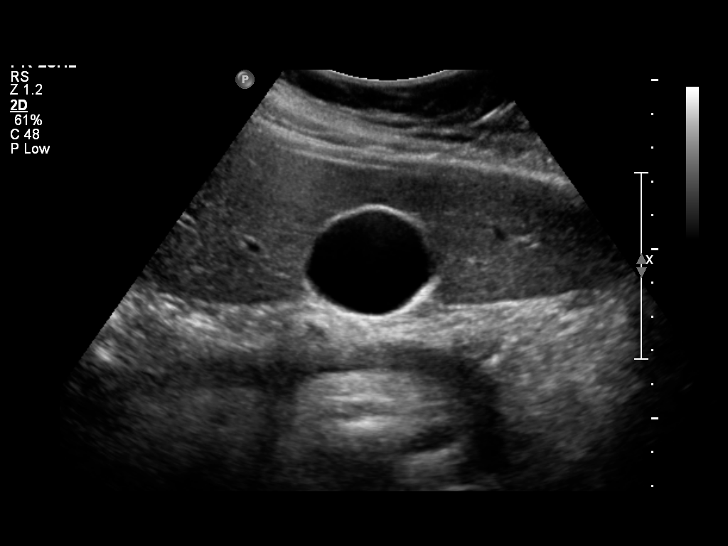
[im 52/78]
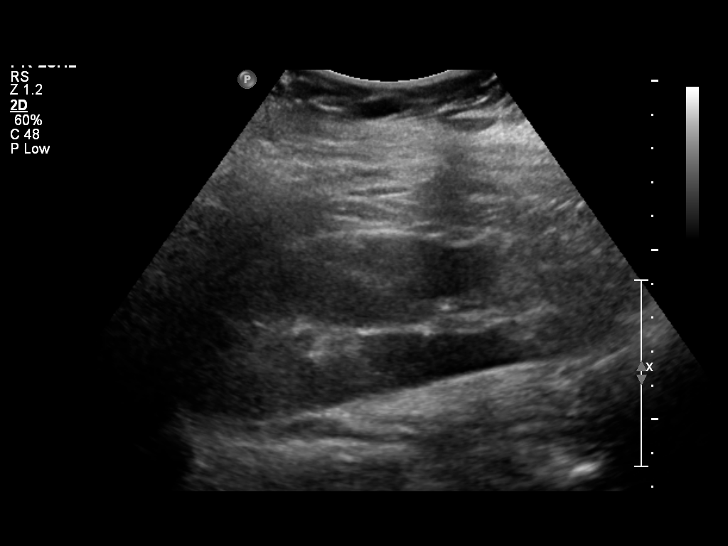
[im 58/78]
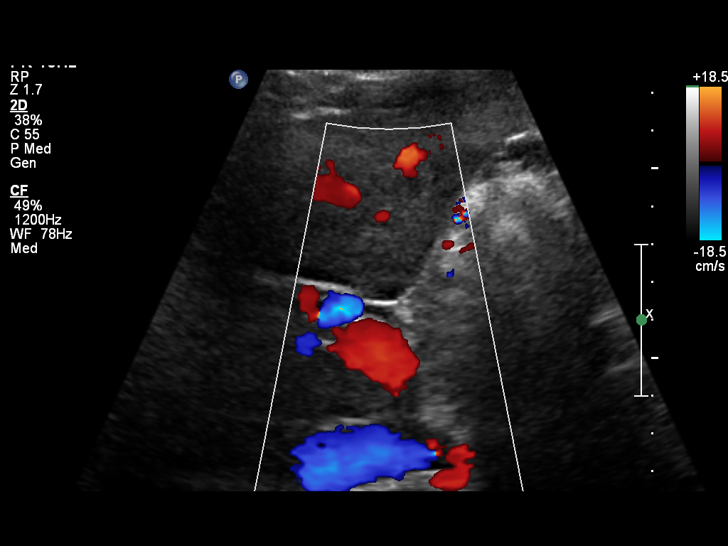
[im 65/78]
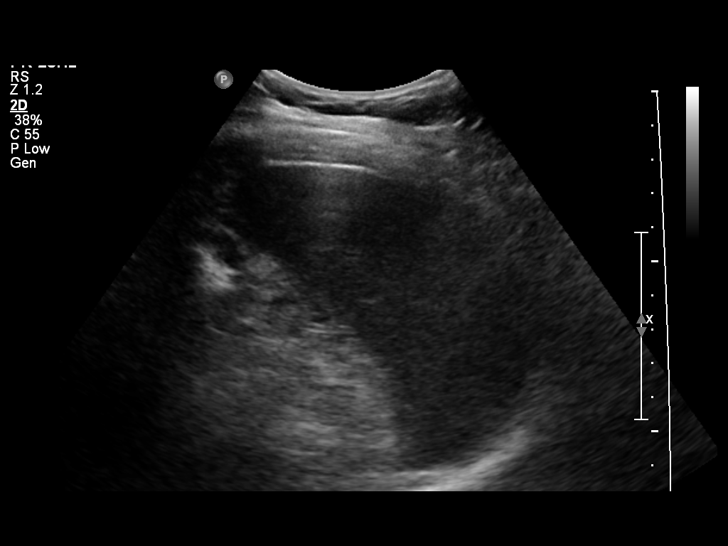
[im 71/78]
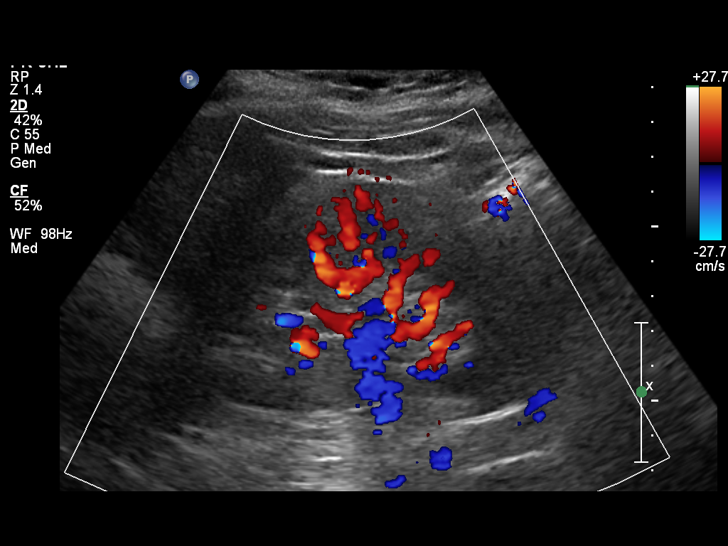
[im 78/78]
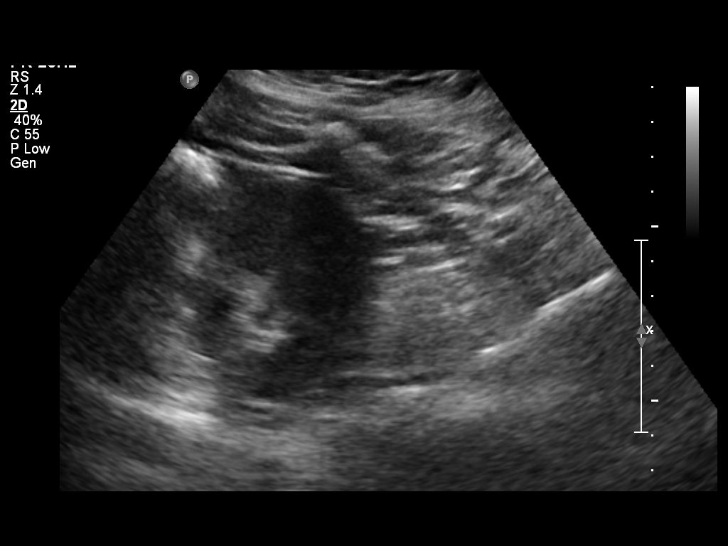

[13 of 25 positions shown; findings below may reference images not displayed]

FINDINGS: Gallbladder:  Sonographically normal.  No echogenic gallstones or
gall sludge.  No gallbladder wall thickening or pericholecystic
fluid.  Negative sonographic Murphy's sign.

Common bile duct:  Normal in size measuring 3.4 mm in diameter

Liver:  Homogeneous hepatic echotexture.  No discrete hepatic
lesions.  No definite evidence of intrahepatic biliary ductal
dilatation.  No ascites.

IVC:  Appears normal.

Pancreas:  Limited visualization of the pancreatic head and neck is
normal.  Visualization of the pancreatic body and tail is obscured
by bowel gas.

Spleen:  Normal in size measuring 9.5 cm in length.

Right Kidney:  Normal cortical thickness, echogenicity and size,
measuring 12.8 cm in length.  No focal renal lesions.  No echogenic
renal stones.  No urinary obstruction.

Left Kidney:  Normal cortical thickness, echogenicity and size,
measuring 13.6 cm in length.  No focal renal lesions.  No echogenic
renal stones.  No urinary obstruction.

Abdominal aorta:  No aneurysm identified.
IMPRESSION: No explanation for the patient's epigastric pain.  Specifically, no
evidence of cholelithiasis or urinary obstruction.

## 2013-12-12 ENCOUNTER — Ambulatory Visit (INDEPENDENT_AMBULATORY_CARE_PROVIDER_SITE_OTHER): Payer: Medicaid Other | Admitting: *Deleted

## 2013-12-12 ENCOUNTER — Encounter: Payer: Self-pay | Admitting: Internal Medicine

## 2013-12-12 ENCOUNTER — Other Ambulatory Visit (HOSPITAL_COMMUNITY)
Admission: RE | Admit: 2013-12-12 | Discharge: 2013-12-12 | Disposition: A | Discharge status: Home / Self Care | Payer: Medicaid Other | Source: Ambulatory Visit | Attending: Internal Medicine | Admitting: Internal Medicine

## 2013-12-12 ENCOUNTER — Ambulatory Visit (INDEPENDENT_AMBULATORY_CARE_PROVIDER_SITE_OTHER): Payer: Medicaid Other | Admitting: Internal Medicine

## 2013-12-12 VITALS — BP 121/76 | HR 80 | Temp 98.3°F | Wt 312.0 lb

## 2013-12-12 DIAGNOSIS — Z113 Encounter for screening for infections with a predominantly sexual mode of transmission: Secondary | ICD-10-CM

## 2013-12-12 DIAGNOSIS — Z23 Encounter for immunization: Secondary | ICD-10-CM

## 2013-12-12 DIAGNOSIS — R7309 Other abnormal glucose: Secondary | ICD-10-CM

## 2013-12-12 DIAGNOSIS — Z21 Asymptomatic human immunodeficiency virus [HIV] infection status: Secondary | ICD-10-CM

## 2013-12-12 DIAGNOSIS — R7303 Prediabetes: Secondary | ICD-10-CM

## 2013-12-12 DIAGNOSIS — N926 Irregular menstruation, unspecified: Secondary | ICD-10-CM

## 2013-12-12 DIAGNOSIS — Z006 Encounter for examination for normal comparison and control in clinical research program: Secondary | ICD-10-CM

## 2013-12-12 DIAGNOSIS — B20 Human immunodeficiency virus [HIV] disease: Secondary | ICD-10-CM

## 2013-12-12 DIAGNOSIS — I10 Essential (primary) hypertension: Secondary | ICD-10-CM

## 2013-12-12 LAB — CBC WITH DIFFERENTIAL/PLATELET
Basophils Absolute: 0 10*3/uL (ref 0.0–0.1)
Basophils Relative: 1 % (ref 0–1)
EOS ABS: 0.1 10*3/uL (ref 0.0–0.7)
Eosinophils Relative: 2 % (ref 0–5)
HCT: 38 % (ref 36.0–46.0)
HEMOGLOBIN: 13.2 g/dL (ref 12.0–15.0)
Lymphocytes Relative: 36 % (ref 12–46)
Lymphs Abs: 1.8 10*3/uL (ref 0.7–4.0)
MCH: 32.8 pg (ref 26.0–34.0)
MCHC: 34.7 g/dL (ref 30.0–36.0)
MCV: 94.5 fL (ref 78.0–100.0)
MONOS PCT: 7 % (ref 3–12)
Monocytes Absolute: 0.3 10*3/uL (ref 0.1–1.0)
NEUTROS ABS: 2.6 10*3/uL (ref 1.7–7.7)
Neutrophils Relative %: 54 % (ref 43–77)
Platelets: 246 10*3/uL (ref 150–400)
RBC: 4.02 MIL/uL (ref 3.87–5.11)
RDW: 14 % (ref 11.5–15.5)
WBC: 4.9 10*3/uL (ref 4.0–10.5)

## 2013-12-12 LAB — COMPLETE METABOLIC PANEL WITH GFR
ALBUMIN: 4.1 g/dL (ref 3.5–5.2)
ALK PHOS: 66 U/L (ref 39–117)
ALT: 17 U/L (ref 0–35)
AST: 18 U/L (ref 0–37)
BUN: 15 mg/dL (ref 6–23)
CO2: 26 mEq/L (ref 19–32)
Calcium: 9.4 mg/dL (ref 8.4–10.5)
Chloride: 104 mEq/L (ref 96–112)
Creat: 0.82 mg/dL (ref 0.50–1.10)
GFR, Est African American: >89 mL/min
GFR, Est Non African American: 89 mL/min
Glucose, Bld: 119 mg/dL — ABNORMAL HIGH (ref 70–99)
POTASSIUM: 4.2 meq/L (ref 3.5–5.3)
SODIUM: 137 meq/L (ref 135–145)
TOTAL PROTEIN: 7.1 g/dL (ref 6.0–8.3)
Total Bilirubin: 0.5 mg/dL (ref 0.2–1.2)

## 2013-12-12 LAB — POCT URINE PREGNANCY: Preg Test, Ur: NEGATIVE

## 2013-12-12 NOTE — Progress Notes (Signed)
Patient ID: Stephanie Dixon, female   DOB: 1970/10/07, 43 y.o.   MRN: [id number]       Patient ID: Stephanie Dixon, female   DOB: 1970/12/17, 43 y.o.   MRN: [id number]  HPI 43yo F with HIV well controlled, CD 4 count of 430/VL<20 on stribild. Doing well with medications. Getting used to living in [place]. No new complaints or health problems  Outpatient Encounter Prescriptions as of 12/12/2013  Medication Sig  . elvitegravir-cobicistat-emtricitabine-tenofovir (STRIBILD) 150-150-200-300 MG TABS tablet TAKE 1 TABLET BY MOUTH DAILY WITH BREAKFAST  . hydrochlorothiazide (HYDRODIURIL) 25 MG tablet Take 1 tablet (25 mg total) by mouth daily.  Marland Kitchen ibuprofen (ADVIL,MOTRIN) 600 MG tablet Take 1 tablet (600 mg total) by mouth every 8 (eight) hours as needed for pain.  . metFORMIN (GLUCOPHAGE) 500 MG tablet TAKE 1 TABLET BY MOUTH TWICE DAILY WITH MEALS     Patient Active Problem List   Diagnosis Date Noted  . HIV (human immunodeficiency virus infection) 04/17/2011  . Advanced maternal age risk, currently pregnant 04/17/2011  . Gestational diabetes 04/17/2011  . Hypertension 04/17/2011  . Obesity 04/17/2011  . Twin liveborn born in hospital by C-section 04/17/2011     Health Maintenance Due  Topic Date Due  . Foot Exam  11/03/1981  . Ophthalmology Exam  11/03/1981  . Urine Microalbumin  11/03/1981  . Pap Smear  11/03/1989  . Influenza Vaccine  10/22/2013     Review of Systems 10 point ros is negative except for diarrhea with metformin and increased urination since starting hctz Physical Exam  BP 121/76  Pulse 80  Temp(Src) 98.3 F (36.8 C) (Oral)  Wt 312 lb (141.522 kg)  LMP 11/05/2013 Physical Exam  Constitutional:  oriented to person, place, and time. appears well-developed and well-nourished. No distress.  HENT:  Mouth/Throat: Oropharynx is clear and moist. No oropharyngeal exudate.  Cardiovascular: Normal rate, regular rhythm and normal heart sounds. Exam reveals no gallop  and no friction rub.  No murmur heard.  Pulmonary/Chest: Effort normal and breath sounds normal. No respiratory distress.  has no wheezes.  Abdominal: Soft. Bowel sounds are normal.  exhibits no distension. There is no tenderness.  Lymphadenopathy: no cervical adenopathy.  Neurological: alert and oriented to person, place, and time.  Skin: Skin is warm and dry. No rash noted. No erythema.  Psychiatric: a normal mood and affect. His behavior is normal.   Lab Results  Component Value Date   CD4TCELL 26* 09/22/2013   Lab Results  Component Value Date   CD4TABS 430 09/22/2013   CD4TABS 560 05/10/2013   CD4TABS 470 12/28/2012   Lab Results  Component Value Date   HIV1RNAQUANT <20 09/22/2013   Lab Results  Component Value Date   HEPBSAB POS* 04/17/2011   No results found for this basename: RPR    CBC Lab Results  Component Value Date   WBC 4.8 09/22/2013   RBC 3.97 09/22/2013   HGB 12.6 09/22/2013   HCT 37.1 09/22/2013   PLT 220 09/22/2013   MCV 93.5 09/22/2013   MCH 31.7 09/22/2013   MCHC 34.0 09/22/2013   RDW 15.0 09/22/2013   LYMPHSABS 1.7 09/22/2013   MONOABS 0.3 09/22/2013   EOSABS 0.1 09/22/2013   BASOSABS 0.0 09/22/2013   BMET Lab Results  Component Value Date   NA 138 09/22/2013   K 4.1 09/22/2013   CL 104 09/22/2013   CO2 25 09/22/2013   GLUCOSE 114* 09/22/2013   BUN 11 09/22/2013   CREATININE 0.72 09/22/2013  CALCIUM 9.2 09/22/2013   GFRNONAA >89 05/10/2013   GFRAA >89 05/10/2013     Assessment and Plan   hiv = well controlled  Health maintenance = will give flu shot  Htn= well controlled BP with HCTZ, but c/o frequent urination  Hyperglycemia = had risk for developing DM due to obesity and gestational diabetes. Can decrease to once aday dosing

## 2013-12-12 NOTE — Progress Notes (Signed)
Stephanie Dixon is here for [id number] visit. Pill count performed and has 54 pills left. Denies any muscle aches and/or weakness. Urine Pregnancy test performed and resulted as negative. Study med was dispensed. Vital signs stable and has no new complaints. She received $75 gift card for visit and travel. Next appointment scheduled for Monday, March 27, 2014 @ 12 noon. Tacey Heap RN

## 2013-12-13 LAB — URINALYSIS, ROUTINE W REFLEX MICROSCOPIC
BILIRUBIN URINE: NEGATIVE
GLUCOSE, UA: NEGATIVE mg/dL
Ketones, ur: NEGATIVE mg/dL
LEUKOCYTES UA: NEGATIVE
Nitrite: NEGATIVE
PH: 5.5 (ref 5.0–8.0)
Protein, ur: NEGATIVE mg/dL
Specific Gravity, Urine: 1.028 (ref 1.005–1.030)
Urobilinogen, UA: 0.2 mg/dL (ref 0.0–1.0)

## 2013-12-13 LAB — T-HELPER CELL (CD4) - (RCID CLINIC ONLY)
CD4 % Helper T Cell: 27 % — ABNORMAL LOW (ref 33–55)
CD4 T Cell Abs: 520 /uL (ref 400–2700)

## 2013-12-13 LAB — URINALYSIS, MICROSCOPIC ONLY
BACTERIA UA: NONE SEEN
Casts: NONE SEEN

## 2013-12-13 LAB — URINE CYTOLOGY ANCILLARY ONLY
Chlamydia: NEGATIVE
Neisseria Gonorrhea: NEGATIVE

## 2013-12-13 LAB — HIV-1 RNA QUANT-NO REFLEX-BLD: HIV-1 RNA Quant, Log: <1.3 {Log} (ref <1.30)

## 2014-03-27 ENCOUNTER — Ambulatory Visit (INDEPENDENT_AMBULATORY_CARE_PROVIDER_SITE_OTHER): Payer: Self-pay | Admitting: *Deleted

## 2014-03-27 ENCOUNTER — Encounter: Payer: Self-pay | Admitting: Internal Medicine

## 2014-03-27 ENCOUNTER — Ambulatory Visit (INDEPENDENT_AMBULATORY_CARE_PROVIDER_SITE_OTHER): Payer: Medicaid Other | Admitting: Internal Medicine

## 2014-03-27 VITALS — BP 149/98 | HR 86 | Temp 97.2°F | Wt 318.0 lb

## 2014-03-27 DIAGNOSIS — Z21 Asymptomatic human immunodeficiency virus [HIV] infection status: Secondary | ICD-10-CM

## 2014-03-27 DIAGNOSIS — N644 Mastodynia: Secondary | ICD-10-CM

## 2014-03-27 DIAGNOSIS — R7309 Other abnormal glucose: Secondary | ICD-10-CM

## 2014-03-27 DIAGNOSIS — R7303 Prediabetes: Secondary | ICD-10-CM

## 2014-03-27 DIAGNOSIS — I10 Essential (primary) hypertension: Secondary | ICD-10-CM

## 2014-03-27 DIAGNOSIS — Z006 Encounter for examination for normal comparison and control in clinical research program: Secondary | ICD-10-CM

## 2014-03-27 LAB — CBC WITH DIFFERENTIAL/PLATELET
Basophils Absolute: 0 10*3/uL (ref 0.0–0.1)
Basophils Relative: 0 % (ref 0–1)
Eosinophils Absolute: 0.1 10*3/uL (ref 0.0–0.7)
Eosinophils Relative: 2 % (ref 0–5)
HCT: 38.9 % (ref 36.0–46.0)
Hemoglobin: 13.1 g/dL (ref 12.0–15.0)
LYMPHS PCT: 32 % (ref 12–46)
Lymphs Abs: 1.9 10*3/uL (ref 0.7–4.0)
MCH: 32.8 pg (ref 26.0–34.0)
MCHC: 33.7 g/dL (ref 30.0–36.0)
MCV: 97.3 fL (ref 78.0–100.0)
MONOS PCT: 7 % (ref 3–12)
MPV: 10.1 fL (ref 8.6–12.4)
Monocytes Absolute: 0.4 10*3/uL (ref 0.1–1.0)
Neutro Abs: 3.5 10*3/uL (ref 1.7–7.7)
Neutrophils Relative %: 59 % (ref 43–77)
PLATELETS: 236 10*3/uL (ref 150–400)
RBC: 4 MIL/uL (ref 3.87–5.11)
RDW: 14.5 % (ref 11.5–15.5)
WBC: 6 10*3/uL (ref 4.0–10.5)

## 2014-03-27 LAB — COMPLETE METABOLIC PANEL WITH GFR
ALK PHOS: 62 U/L (ref 39–117)
ALT: 19 U/L (ref 0–35)
AST: 17 U/L (ref 0–37)
Albumin: 4 g/dL (ref 3.5–5.2)
BUN: 10 mg/dL (ref 6–23)
CO2: 24 mEq/L (ref 19–32)
CREATININE: 0.71 mg/dL (ref 0.50–1.10)
Calcium: 9.2 mg/dL (ref 8.4–10.5)
Chloride: 105 mEq/L (ref 96–112)
GFR, Est African American: >89 mL/min
GFR, Est Non African American: >89 mL/min
Glucose, Bld: 130 mg/dL — ABNORMAL HIGH (ref 70–99)
Potassium: 4.2 mEq/L (ref 3.5–5.3)
Sodium: 135 mEq/L (ref 135–145)
Total Bilirubin: 0.5 mg/dL (ref 0.2–1.2)
Total Protein: 7.1 g/dL (ref 6.0–8.3)

## 2014-03-27 MED ORDER — METFORMIN HCL 500 MG PO TABS: ORAL_TABLET | ORAL | Status: DC

## 2014-03-27 NOTE — Progress Notes (Signed)
Patient ID: Stephanie Dixon, female   DOB: 1970-04-05, 44 y.o.   MRN: [id number]       Patient ID: Stephanie Dixon, female   DOB: 16-Jan-1971, 44 y.o.   MRN: [id number]  HPI 44yo F with HIV disease, CD 4 count of 520/VL<20 (sep 2015) on stribild. Reports numerous stressors, including housing. She states that she has not taken her morning medications as of yet due to having to leave home before eating breakfast. She is taking her medications as directed. Ran out of metformin. She states that she is noticing her right breast aching as if she is about to have her menstrual cycle,although too early, since she had her cycle 2 weeks ago. Denies any trauma to right breast, no discharge.  Outpatient Encounter Prescriptions as of 03/27/2014  Medication Sig  . elvitegravir-cobicistat-emtricitabine-tenofovir (STRIBILD) 150-150-200-300 MG TABS tablet TAKE 1 TABLET BY MOUTH DAILY WITH BREAKFAST  . hydrochlorothiazide (HYDRODIURIL) 25 MG tablet Take 1 tablet (25 mg total) by mouth daily.  Marland Kitchen ibuprofen (ADVIL,MOTRIN) 600 MG tablet Take 1 tablet (600 mg total) by mouth every 8 (eight) hours as needed for pain.  . metFORMIN (GLUCOPHAGE) 500 MG tablet TAKE 1 TABLET BY MOUTH TWICE DAILY WITH MEALS     Patient Active Problem List   Diagnosis Date Noted  . HIV (human immunodeficiency virus infection) 04/17/2011  . Advanced maternal age risk, currently pregnant 04/17/2011  . Gestational diabetes 04/17/2011  . Hypertension 04/17/2011  . Obesity 04/17/2011  . Twin liveborn born in hospital by C-section 04/17/2011     Health Maintenance Due  Topic Date Due  . FOOT EXAM  11/03/1981  . OPHTHALMOLOGY EXAM  11/03/1981  . URINE MICROALBUMIN  11/03/1981  . PAP SMEAR  11/03/1989  . HEMOGLOBIN A1C  03/25/2014     Review of Systems 10 point ros is negative ,except what is mentioned above in hpi Physical Exam   BP 149/98 mmHg  Pulse 86  Temp(Src) 97.2 F (36.2 C) (Oral)  Wt 318 lb (144.244 kg)  LMP  03/06/2014 Physical Exam  Constitutional:  oriented to person, place, and time. appears well-developed and well-nourished. No distress.  HENT:  Mouth/Throat: Oropharynx is clear and moist. No oropharyngeal exudate.  Cardiovascular: Normal rate, regular rhythm and normal heart sounds. Exam reveals no gallop and no friction rub.  No murmur heard.  Pulmonary/Chest: Effort normal and breath sounds normal. No respiratory distress.  has no wheezes.  Abdominal: Soft. Bowel sounds are normal.  exhibits no distension. There is no tenderness.  Chest = right breast fullness incomparison to left breast. No discreet tender lump. No change in skin surface of breast tissue Skin: Skin is warm and dry. No rash noted. No erythema.  Psychiatric: a normal mood and affect. behavior is normal.    Lab Results  Component Value Date   CD4TCELL 27* 12/12/2013   Lab Results  Component Value Date   CD4TABS 520 12/12/2013   CD4TABS 430 09/22/2013   CD4TABS 560 05/10/2013   Lab Results  Component Value Date   HIV1RNAQUANT <20 12/12/2013   Lab Results  Component Value Date   HEPBSAB POS* 04/17/2011   No results found for: RPR  CBC Lab Results  Component Value Date   WBC 4.9 12/12/2013   RBC 4.02 12/12/2013   HGB 13.2 12/12/2013   HCT 38.0 12/12/2013   PLT 246 12/12/2013   MCV 94.5 12/12/2013   MCH 32.8 12/12/2013   MCHC 34.7 12/12/2013   RDW 14.0 12/12/2013   LYMPHSABS 1.8  12/12/2013   MONOABS 0.3 12/12/2013   EOSABS 0.1 12/12/2013   BASOSABS 0.0 12/12/2013   BMET Lab Results  Component Value Date   NA 137 12/12/2013   K 4.2 12/12/2013   CL 104 12/12/2013   CO2 26 12/12/2013   GLUCOSE 119* 12/12/2013   BUN 15 12/12/2013   CREATININE 0.82 12/12/2013   CALCIUM 9.4 12/12/2013   GFRNONAA 89 12/12/2013   GFRAA >89 12/12/2013     Assessment and Plan  hiv = well controlled. Will continue with stribild. Will check cd 4 count and viral load, cbc, and bmp  Hx of gestational  diabetes/hyperglycemia = will check hemoglobin A1c, refill metformin  HTN = poorly controlled started last year on HCTZ. HTN likely in part due to weight gain. Will add amlodipine if still elevated at next visit. She states that she did not take her HTN medication this morning.  Obesity = over the two years, she has gained considerable weight. Will see if can refer to nutritionist. She cares for numerous children/grandchildren, precludes her from exercise.  Breast tenderness= likely transient. wil continue to follow at next appt

## 2014-03-27 NOTE — Progress Notes (Signed)
Stephanie Dixon was here today to see dr. Drue Second and for her month 8 Reprieve study visit. She denies any problems currently and says she has mostly been adherent about taking her meds but had a short period of time where she was in between places to live and didn't have her meds. She will return in May for study and to see dr. Drue Second.

## 2014-03-28 LAB — HEMOGLOBIN A1C
Hgb A1c MFr Bld: 7 % — ABNORMAL HIGH (ref <5.7)
Mean Plasma Glucose: 154 mg/dL — ABNORMAL HIGH (ref <117)

## 2014-03-28 LAB — T-HELPER CELL (CD4) - (RCID CLINIC ONLY)
CD4 T CELL ABS: 520 /uL (ref 400–2700)
CD4 T CELL HELPER: 30 % — AB (ref 33–55)

## 2014-03-28 LAB — HIV-1 RNA QUANT-NO REFLEX-BLD
HIV 1 RNA Quant: <20 copies/mL (ref <20)
HIV-1 RNA Quant, Log: <1.3 {Log} (ref <1.30)

## 2014-03-28 LAB — PREGNANCY, URINE: Preg Test, Ur: NEGATIVE

## 2014-03-30 ENCOUNTER — Other Ambulatory Visit: Payer: Self-pay | Admitting: Internal Medicine

## 2014-04-04 ENCOUNTER — Telehealth: Payer: Self-pay | Admitting: *Deleted

## 2014-04-04 NOTE — Telephone Encounter (Signed)
Patient is accessing [facility] [employer], academic([employer]) services for financial assistance.  [facility] needs only proof of positivity and last office note to provide services. [name] [facility] faxed a signed consent and request.  Last office note and last viral load faxed to [phone]. Andree CossHowell, Shavonna Corella M, RN

## 2014-04-22 ENCOUNTER — Other Ambulatory Visit: Payer: Self-pay | Admitting: Internal Medicine

## 2014-05-31 ENCOUNTER — Other Ambulatory Visit: Payer: Self-pay | Admitting: *Deleted

## 2014-05-31 DIAGNOSIS — Z113 Encounter for screening for infections with a predominantly sexual mode of transmission: Secondary | ICD-10-CM

## 2014-07-24 ENCOUNTER — Ambulatory Visit: Payer: Medicaid Other | Admitting: Internal Medicine

## 2014-07-25 ENCOUNTER — Ambulatory Visit: Payer: Medicaid Other | Admitting: Internal Medicine

## 2014-09-11 ENCOUNTER — Encounter: Payer: Self-pay | Admitting: Internal Medicine

## 2014-09-11 ENCOUNTER — Ambulatory Visit (INDEPENDENT_AMBULATORY_CARE_PROVIDER_SITE_OTHER): Payer: Medicaid Other | Admitting: Internal Medicine

## 2014-09-11 ENCOUNTER — Encounter: Payer: Medicaid Other | Admitting: *Deleted

## 2014-09-11 VITALS — BP 164/94 | HR 106 | Temp 97.1°F | Wt 305.0 lb

## 2014-09-11 DIAGNOSIS — I10 Essential (primary) hypertension: Secondary | ICD-10-CM | POA: Diagnosis not present

## 2014-09-11 DIAGNOSIS — R7309 Other abnormal glucose: Secondary | ICD-10-CM

## 2014-09-11 DIAGNOSIS — R7303 Prediabetes: Secondary | ICD-10-CM

## 2014-09-11 DIAGNOSIS — B354 Tinea corporis: Secondary | ICD-10-CM

## 2014-09-11 DIAGNOSIS — B20 Human immunodeficiency virus [HIV] disease: Secondary | ICD-10-CM | POA: Diagnosis not present

## 2014-09-11 DIAGNOSIS — Z006 Encounter for examination for normal comparison and control in clinical research program: Secondary | ICD-10-CM

## 2014-09-11 LAB — COMPREHENSIVE METABOLIC PANEL
ALT: 19 U/L (ref 0–35)
AST: 18 U/L (ref 0–37)
Albumin: 4.1 g/dL (ref 3.5–5.2)
Alkaline Phosphatase: 74 U/L (ref 39–117)
BUN: 11 mg/dL (ref 6–23)
CALCIUM: 9 mg/dL (ref 8.4–10.5)
CHLORIDE: 103 meq/L (ref 96–112)
CO2: 26 meq/L (ref 19–32)
CREATININE: 0.74 mg/dL (ref 0.50–1.10)
Glucose, Bld: 135 mg/dL — ABNORMAL HIGH (ref 70–99)
Potassium: 3.6 mEq/L (ref 3.5–5.3)
Sodium: 139 mEq/L (ref 135–145)
Total Bilirubin: 0.5 mg/dL (ref 0.2–1.2)
Total Protein: 7 g/dL (ref 6.0–8.3)

## 2014-09-11 LAB — CBC WITH DIFFERENTIAL/PLATELET
Basophils Absolute: 0 10*3/uL (ref 0.0–0.1)
Basophils Relative: 0 % (ref 0–1)
EOS PCT: 2 % (ref 0–5)
Eosinophils Absolute: 0.1 10*3/uL (ref 0.0–0.7)
HCT: 38.6 % (ref 36.0–46.0)
Hemoglobin: 13.1 g/dL (ref 12.0–15.0)
LYMPHS PCT: 39 % (ref 12–46)
Lymphs Abs: 2.1 10*3/uL (ref 0.7–4.0)
MCH: 33 pg (ref 26.0–34.0)
MCHC: 33.9 g/dL (ref 30.0–36.0)
MCV: 97.2 fL (ref 78.0–100.0)
MONO ABS: 0.4 10*3/uL (ref 0.1–1.0)
MPV: 10 fL (ref 8.6–12.4)
Monocytes Relative: 7 % (ref 3–12)
Neutro Abs: 2.8 10*3/uL (ref 1.7–7.7)
Neutrophils Relative %: 52 % (ref 43–77)
Platelets: 223 10*3/uL (ref 150–400)
RBC: 3.97 MIL/uL (ref 3.87–5.11)
RDW: 14.4 % (ref 11.5–15.5)
WBC: 5.3 10*3/uL (ref 4.0–10.5)

## 2014-09-11 LAB — LIPID PANEL
CHOLESTEROL: 133 mg/dL (ref 0–200)
HDL: 29 mg/dL — ABNORMAL LOW (ref >=46)
LDL Cholesterol: 60 mg/dL (ref 0–99)
Total CHOL/HDL Ratio: 4.6 Ratio
Triglycerides: 220 mg/dL — ABNORMAL HIGH (ref <150)
VLDL: 44 mg/dL — ABNORMAL HIGH (ref 0–40)

## 2014-09-11 LAB — POCT URINE PREGNANCY: Preg Test, Ur: NEGATIVE

## 2014-09-11 MED ORDER — KETOCONAZOLE 2 % EX CREA: 1.0000 "application " | TOPICAL_CREAM | Freq: Every day | CUTANEOUS | Status: AC

## 2014-09-11 MED ORDER — METFORMIN HCL 500 MG PO TABS: ORAL_TABLET | ORAL | Status: AC

## 2014-09-11 NOTE — Progress Notes (Signed)
Patient ID: Stephanie Dixon, female   DOB: [birth year], 44 y.o.   MRN: [id number]       Patient ID: Stephanie Dixon, female   DOB: 11-10-1970, 44 y.o.   MRN: [id number]  HPI 44yo F with HIV disease, well controlled. Currently taking stribild. Still having difficulty with losing weight. She states that she recently moved into her own place, able to cook. She has noticed rash to chest   Outpatient Encounter Prescriptions as of 09/11/2014  Medication Sig  . elvitegravir-cobicistat-emtricitabine-tenofovir (STRIBILD) 150-150-200-300 MG TABS tablet TAKE 1 TABLET BY MOUTH DAILY WITH BREAKFAST  . hydrochlorothiazide (HYDRODIURIL) 25 MG tablet Take 1 tablet (25 mg total) by mouth daily.  Marland Kitchen ibuprofen (ADVIL,MOTRIN) 600 MG tablet Take 1 tablet (600 mg total) by mouth every 8 (eight) hours as needed for pain.  . metFORMIN (GLUCOPHAGE) 500 MG tablet TAKE 1 TABLET BY MOUTH TWICE DAILY WITH MEALS  . metFORMIN (GLUCOPHAGE) 500 MG tablet TAKE 1 TABLET BY MOUTH TWICE DAILY WITH MEALS  . metFORMIN (GLUCOPHAGE) 500 MG tablet TAKE 1 TABLET BY MOUTH TWICE DAILY WITH MEALS   No facility-administered encounter medications on file as of 09/11/2014.     Patient Active Problem List   Diagnosis Date Noted  . HIV (human immunodeficiency virus infection) 04/17/2011  . Advanced maternal age risk, currently pregnant 04/17/2011  . Gestational diabetes 04/17/2011  . Hypertension 04/17/2011  . Obesity 04/17/2011  . Twin liveborn born in hospital by C-section 04/17/2011     Health Maintenance Due  Topic Date Due  . FOOT EXAM  11/03/1981  . OPHTHALMOLOGY EXAM  11/03/1981  . URINE MICROALBUMIN  11/03/1981  . PAP SMEAR  11/03/1989     Review of Systems10 point ros is negative Physical Exam BP 164/94 mmHg  Pulse 106  Temp(Src) 97.1 F (36.2 C) (Oral)  Wt 305 lb (138.347 kg) Physical Exam  Constitutional:  oriented to person, place, and time. appears well-developed and well-nourished. No distress.  HENT:  Pump Back/AT, PERRLA, no scleral icterus Mouth/Throat: Oropharynx is clear and moist. No oropharyngeal exudate.  Cardiovascular: Normal rate, regular rhythm and normal heart sounds. Exam reveals no gallop and no friction rub.  No murmur heard.  Pulmonary/Chest: Effort normal and breath sounds normal. No respiratory distress.  has no wheezes.  Neck = supple,  Lymphadenopathy: no cervical adenopathy. No axillary adenopathy Skin: Skin is warm and dry. Hypopigmented slightly raised mostly macules to chest Psychiatric: a normal mood and affect.  behavior is normal.     Lab Results  Component Value Date   CD4TCELL 30* 03/27/2014   Lab Results  Component Value Date   CD4TABS 520 03/27/2014   CD4TABS 520 12/12/2013   CD4TABS 430 09/22/2013   Lab Results  Component Value Date   HIV1RNAQUANT <20 03/27/2014   Lab Results  Component Value Date   HEPBSAB POS* 04/17/2011   No results found for: RPR  CBC Lab Results  Component Value Date   WBC 6.0 03/27/2014   RBC 4.00 03/27/2014   HGB 13.1 03/27/2014   HCT 38.9 03/27/2014   PLT 236 03/27/2014   MCV 97.3 03/27/2014   MCH 32.8 03/27/2014   MCHC 33.7 03/27/2014   RDW 14.5 03/27/2014   LYMPHSABS 1.9 03/27/2014   MONOABS 0.4 03/27/2014   EOSABS 0.1 03/27/2014   BASOSABS 0.0 03/27/2014   BMET Lab Results  Component Value Date   NA 135 03/27/2014   K 4.2 03/27/2014   CL 105 03/27/2014   CO2 24 03/27/2014   GLUCOSE  130* 03/27/2014   BUN 10 03/27/2014   CREATININE 0.71 03/27/2014   CALCIUM 9.2 03/27/2014   GFRNONAA >89 03/27/2014   GFRAA >89 03/27/2014     Assessment and Plan  hiv disease = had labs done today. Continue on stribild  Insulin resistance/pre diabetic = metformin  bid  Htn, poorly controlled = continue on hctz, she just took it on the way to clinic which could explain her elevated bp. May need to add second agent like amlodipine  next visit  Tinea corporis = will try antifungal cream, ketoconazole 2%  cream daily

## 2014-09-12 LAB — T-HELPER CELL (CD4) - (RCID CLINIC ONLY)
CD4 % Helper T Cell: 29 % — ABNORMAL LOW (ref 33–55)
CD4 T CELL ABS: 610 /uL (ref 400–2700)

## 2014-09-13 LAB — HIV-1 RNA QUANT-NO REFLEX-BLD: HIV-1 RNA Quant, Log: <1.3 {Log} (ref <1.30)

## 2014-09-30 ENCOUNTER — Other Ambulatory Visit: Payer: Self-pay | Admitting: Internal Medicine

## 2014-09-30 DIAGNOSIS — B20 Human immunodeficiency virus [HIV] disease: Secondary | ICD-10-CM

## 2014-09-30 DIAGNOSIS — I10 Essential (primary) hypertension: Secondary | ICD-10-CM

## 2014-09-30 MED ORDER — HYDROCHLOROTHIAZIDE 25 MG PO TABS: 25.0000 mg | ORAL_TABLET | Freq: Every day | ORAL | Status: DC

## 2014-09-30 MED ORDER — ELVITEG-COBIC-EMTRICIT-TENOFAF 150-150-200-10 MG PO TABS: 1.0000 | ORAL_TABLET | Freq: Every day | ORAL | Status: DC

## 2014-10-01 ENCOUNTER — Other Ambulatory Visit: Payer: Self-pay | Admitting: Internal Medicine

## 2014-11-02 ENCOUNTER — Other Ambulatory Visit: Payer: Self-pay | Admitting: *Deleted

## 2014-11-02 DIAGNOSIS — B20 Human immunodeficiency virus [HIV] disease: Secondary | ICD-10-CM

## 2014-11-02 DIAGNOSIS — I10 Essential (primary) hypertension: Secondary | ICD-10-CM

## 2014-11-02 MED ORDER — ELVITEG-COBIC-EMTRICIT-TENOFAF 150-150-200-10 MG PO TABS: 1.0000 | ORAL_TABLET | Freq: Every day | ORAL | Status: DC

## 2014-11-02 MED ORDER — HYDROCHLOROTHIAZIDE 25 MG PO TABS: ORAL_TABLET | ORAL | Status: DC

## 2014-12-01 ENCOUNTER — Other Ambulatory Visit: Payer: Self-pay | Admitting: *Deleted

## 2014-12-01 DIAGNOSIS — B20 Human immunodeficiency virus [HIV] disease: Secondary | ICD-10-CM

## 2014-12-01 MED ORDER — ELVITEG-COBIC-EMTRICIT-TENOFAF 150-150-200-10 MG PO TABS: 1.0000 | ORAL_TABLET | Freq: Every day | ORAL | Status: DC

## 2014-12-02 ENCOUNTER — Other Ambulatory Visit: Payer: Self-pay | Admitting: Internal Medicine

## 2014-12-02 DIAGNOSIS — I10 Essential (primary) hypertension: Secondary | ICD-10-CM

## 2014-12-02 DIAGNOSIS — B20 Human immunodeficiency virus [HIV] disease: Secondary | ICD-10-CM

## 2014-12-05 ENCOUNTER — Ambulatory Visit: Payer: Medicaid Other | Admitting: Internal Medicine

## 2014-12-07 ENCOUNTER — Encounter (INDEPENDENT_AMBULATORY_CARE_PROVIDER_SITE_OTHER): Payer: Self-pay | Admitting: *Deleted

## 2014-12-07 VITALS — BP 146/90 | HR 90 | Temp 98.0°F | Resp 17 | Wt 315.2 lb

## 2014-12-07 DIAGNOSIS — Z006 Encounter for examination for normal comparison and control in clinical research program: Secondary | ICD-10-CM

## 2014-12-08 LAB — POCT URINE PREGNANCY: PREG TEST UR: NEGATIVE

## 2014-12-08 NOTE — Progress Notes (Signed)
Stephanie Dixon is here for A5332, month 16. She denies any new problems or issues. She denies any muscle aches/pains. Pill count performed and #4 remain. She should have #71 remaining. She stated she does not have any other bottles. I discussed the correct administration process (one tablet daily with or without a meal). I discussed the importance of taking the study medication correctly and she verbalized how to take the medication and that she understood the importance of adhering to her regimen. Study medication was dispensed #90 which means she will run out of medication prior to her 20 month appointment. Will get her more study drug before she runs out. Next appointment pending. Tacey Heap RN

## 2014-12-09 ENCOUNTER — Other Ambulatory Visit: Payer: Self-pay | Admitting: Internal Medicine

## 2014-12-09 DIAGNOSIS — B20 Human immunodeficiency virus [HIV] disease: Secondary | ICD-10-CM

## 2014-12-09 DIAGNOSIS — I10 Essential (primary) hypertension: Secondary | ICD-10-CM

## 2015-01-23 ENCOUNTER — Ambulatory Visit: Payer: Medicaid Other | Admitting: Internal Medicine

## 2015-02-12 ENCOUNTER — Telehealth: Payer: Self-pay | Admitting: *Deleted

## 2015-02-12 NOTE — Telephone Encounter (Signed)
Patient now lives in [place], but is still [facility].  Due to transportation and financial issues, she needs to schedule her appointments on the same day. She has a research appointment the first week in December, but can reschedule it to accommodate Dr. Drue SecondSnider.  Per Research, 12/14 would work for them.  RN offered 12/14, but she patient states that is too far away.  She states that she has "many different things going on" and needs to talk with Dr. Drue SecondSnider.  She would like to know if Dr. Drue SecondSnider would overbook her on 12/6 or 12/7.  RN advised that Dr. Feliz BeamSnider's schedule is quite heavy those days; patient stated "I'm sure she'll squeeze me in somewhere."  Patient would not disclose what issues she needs to discuss with Dr. Drue SecondSnider. Please advise. Andree CossHowell, Michelle M, RN

## 2015-02-14 NOTE — Telephone Encounter (Signed)
Fit her in for what is convenient

## 2015-02-22 NOTE — Telephone Encounter (Signed)
1:45 on 12/7; Research will work around her clinic appointment.

## 2015-02-28 ENCOUNTER — Ambulatory Visit: Payer: Medicaid Other | Admitting: Internal Medicine

## 2015-03-28 ENCOUNTER — Encounter (INDEPENDENT_AMBULATORY_CARE_PROVIDER_SITE_OTHER): Payer: Medicaid Other | Admitting: *Deleted

## 2015-03-28 VITALS — BP 138/80 | HR 98 | Temp 98.1°F | Resp 16 | Wt 288.5 lb

## 2015-03-28 DIAGNOSIS — Z006 Encounter for examination for normal comparison and control in clinical research program: Secondary | ICD-10-CM

## 2015-03-28 LAB — POCT URINE PREGNANCY: Preg Test, Ur: NEGATIVE

## 2015-03-28 NOTE — Progress Notes (Signed)
Stephanie GowerCharlotte here today for month 20 visit for [phone]  A Randomized Trial to Prevent Vascular Events in HIV (The REPRIEVE Study). She denies any new problems or issues. States that she took her last dose of study medication (Pitavastatin/Placebo) yesterday (03/27/15). Noticed that past pill counts have not been correct. She states good adherence and understanding of medication dosing. Study medication dispensed today #180. Will continue to monitor and provide education and support. She states that she is currently seeing Dr. Canary BrimWeinrib with [facility] in [place] for her HIV care. She stated that as soon as her lease ends she will be moving back to [place] area and res[facility]. Next study visit scheduled for 08/07/15 at 11am.

## 2015-08-03 ENCOUNTER — Encounter (INDEPENDENT_AMBULATORY_CARE_PROVIDER_SITE_OTHER): Payer: Medicaid Other | Admitting: *Deleted

## 2015-08-03 VITALS — HR 74 | Temp 98.6°F | Resp 16 | Wt 297.5 lb

## 2015-08-03 DIAGNOSIS — Z006 Encounter for examination for normal comparison and control in clinical research program: Secondary | ICD-10-CM

## 2015-08-03 LAB — HCG, SERUM, QUALITATIVE: Preg, Serum: NEGATIVE

## 2015-08-03 NOTE — Progress Notes (Signed)
Stephanie GowerCharlotte is here for her month 2724  Visit for Reprieve, A Randomized Trial to Prevent Vascular Events in HIV (study drug is Pitavastatin 4mg  or placebo). She said she ran out of meds yesterday and did not bring any bottles back. She should have had around 60 tablets remaining. She denies taking any more than 1/day along with her HIV medications.We will only give her 90 tablets at this visit and will have to send her more before the next one is due in September. She denies any muscle weakness or pain or any new problems or medications. She is currently seen by an ID physician in [place] so I will request her labs she said were drawn in February.

## 2016-04-02 ENCOUNTER — Encounter (INDEPENDENT_AMBULATORY_CARE_PROVIDER_SITE_OTHER): Payer: Self-pay | Admitting: *Deleted

## 2016-04-02 VITALS — BP 154/84 | HR 96 | Temp 97.8°F | Wt 306.8 lb

## 2016-04-02 DIAGNOSIS — Z006 Encounter for examination for normal comparison and control in clinical research program: Secondary | ICD-10-CM

## 2016-04-02 LAB — POCT URINE PREGNANCY: Preg Test, Ur: NEGATIVE

## 2016-04-02 NOTE — Progress Notes (Signed)
Stephanie GowerCharlotte is here for month 32 visit [phone], A Randomized Trial to Prevent Vascular Events in HIV (The REPRIEVE Study). Verbalized excellent adherence with study medication. Denied any missed doses. No new complaints or concerns verbalized. Denied any muscle aches or weakness. Next visit is scheduled for 07/16/16 @ 12:00pm.

## 2016-08-28 ENCOUNTER — Encounter (INDEPENDENT_AMBULATORY_CARE_PROVIDER_SITE_OTHER): Payer: Medicaid Other | Admitting: *Deleted

## 2016-08-28 VITALS — BP 134/68 | HR 80 | Temp 98.3°F | Wt 299.2 lb

## 2016-08-28 DIAGNOSIS — Z006 Encounter for examination for normal comparison and control in clinical research program: Secondary | ICD-10-CM

## 2016-08-28 NOTE — Progress Notes (Signed)
Stephanie GowerCharlotte is here for her month 36 visit for Reprieve, A Randomized Trial to Prevent Vascular Events in HIV (study drug is Pitavastatin 4mg  or placebo). She was consented to the new V4.0 of Reprieve per SOP guidelines. She is late for the visit because she did not have any transportation to here from [place], She was able to come today because her daughter was able to bring her. She denies any new problems or medications. And says her adherence is very good. She will come in August for the next scheduled visit for Reprieve to get back on schedule for the study.

## 2016-08-29 LAB — PREGNANCY, URINE: Preg Test, Ur: NEGATIVE

## 2016-10-27 ENCOUNTER — Encounter: Payer: Medicaid Other | Admitting: *Deleted

## 2021-03-05 ENCOUNTER — Ambulatory Visit: Payer: Self-pay | Admitting: Infectious Diseases

## 2021-03-05 ENCOUNTER — Ambulatory Visit: Payer: Self-pay

## 2021-03-05 ENCOUNTER — Ambulatory Visit: Payer: Self-pay | Admitting: Pharmacist
